# Patient Record
Sex: Male | Born: 1990 | Race: Black or African American | Hispanic: No | Marital: Single | Smoking: Former smoker
Health system: Southern US, Community
[De-identification: ages and names within clinical notes are randomized; demographics above are authoritative.]

## PROBLEM LIST (undated history)

## (undated) DIAGNOSIS — M23301 Other meniscus derangements, unspecified lateral meniscus, left knee: Secondary | ICD-10-CM

## (undated) DIAGNOSIS — S83519A Sprain of anterior cruciate ligament of unspecified knee, initial encounter: Secondary | ICD-10-CM

## (undated) HISTORY — PX: FINGER SURGERY: SHX640

---
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
## 2010-04-04 ENCOUNTER — Emergency Department (HOSPITAL_COMMUNITY): Admission: EM | Admit: 2010-04-04 | Discharge: 2010-04-04 | Payer: Self-pay | Admitting: Emergency Medicine

## 2010-05-02 ENCOUNTER — Emergency Department (HOSPITAL_COMMUNITY): Admission: EM | Admit: 2010-05-02 | Discharge: 2010-05-02 | Payer: Self-pay | Admitting: Emergency Medicine

## 2010-06-08 ENCOUNTER — Emergency Department (HOSPITAL_COMMUNITY): Admission: EM | Admit: 2010-06-08 | Discharge: 2010-06-08 | Payer: Self-pay | Admitting: Family Medicine

## 2010-06-08 ENCOUNTER — Emergency Department (HOSPITAL_COMMUNITY): Admission: EM | Admit: 2010-06-08 | Discharge: 2010-06-08 | Payer: Self-pay | Admitting: Emergency Medicine

## 2010-06-30 ENCOUNTER — Emergency Department (HOSPITAL_COMMUNITY): Admission: EM | Admit: 2010-06-30 | Discharge: 2010-06-30 | Payer: Self-pay | Admitting: Emergency Medicine

## 2010-10-29 LAB — POCT RAPID STREP A (OFFICE): Streptococcus, Group A Screen (Direct): NEGATIVE

## 2010-12-01 LAB — POCT URINALYSIS DIP (DEVICE)
Bilirubin Urine: NEGATIVE
Glucose, UA: NEGATIVE mg/dL
Hgb urine dipstick: NEGATIVE
Nitrite: NEGATIVE
Protein, ur: 30 mg/dL — AB
Specific Gravity, Urine: 1.02 (ref 1.005–1.030)
Urobilinogen, UA: 0.2 mg/dL (ref 0.0–1.0)
pH: 5 (ref 5.0–8.0)

## 2010-12-01 LAB — POCT I-STAT, CHEM 8
Calcium, Ion: 1.11 mmol/L — ABNORMAL LOW (ref 1.12–1.32)
Glucose, Bld: 98 mg/dL (ref 70–99)
HCT: 57 % — ABNORMAL HIGH (ref 36.0–49.0)
Hemoglobin: 19.4 g/dL — ABNORMAL HIGH (ref 12.0–16.0)
TCO2: 20 mmol/L (ref 0–100)
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
## 2012-08-22 ENCOUNTER — Encounter (HOSPITAL_COMMUNITY): Payer: Self-pay | Admitting: Emergency Medicine

## 2012-08-22 ENCOUNTER — Emergency Department (HOSPITAL_COMMUNITY)
Admission: EM | Admit: 2012-08-22 | Discharge: 2012-08-22 | Disposition: A | Discharge status: Home / Self Care | Payer: Self-pay | Attending: Emergency Medicine | Admitting: Emergency Medicine

## 2012-08-22 DIAGNOSIS — L03319 Cellulitis of trunk, unspecified: Secondary | ICD-10-CM | POA: Insufficient documentation

## 2012-08-22 DIAGNOSIS — L0291 Cutaneous abscess, unspecified: Secondary | ICD-10-CM

## 2012-08-22 DIAGNOSIS — R509 Fever, unspecified: Secondary | ICD-10-CM | POA: Insufficient documentation

## 2012-08-22 DIAGNOSIS — L02219 Cutaneous abscess of trunk, unspecified: Secondary | ICD-10-CM | POA: Insufficient documentation

## 2012-08-22 MED ORDER — ACETAMINOPHEN-CODEINE #3 300-30 MG PO TABS: 1.0000 | ORAL_TABLET | Freq: Four times a day (QID) | ORAL | Status: DC | PRN

## 2012-08-22 NOTE — ED Notes (Addendum)
Pt reports drainage coming from abscess; states his pain is getting better; pt given chux to place underneath drainage; odor noted;

## 2012-08-22 NOTE — ED Notes (Signed)
Discharge paperwork given to pt; prescriptions and follow-up care discussed with pt; pt had no additional questions regarding discharge or follow-up care; VSS; e-signature obtained;

## 2012-08-22 NOTE — ED Notes (Signed)
Pt reports wanting something to pain; MD notified; stated he wants to I & D abscess first; pt made aware;

## 2012-08-22 NOTE — ED Notes (Signed)
Pt c/o abscess to left thigh x 2 days with pain; pt denies drainage

## 2012-08-22 NOTE — ED Notes (Signed)
Pt given apple juice per request.

## 2012-08-22 NOTE — ED Provider Notes (Addendum)
History   This chart was scribed for Derwood Kaplan, MD by Gerlean Ren, ED Scribe. This patient was seen in room TR08C/TR08C and the patient's care was started at 4:45 PM    CSN: [id number]  Arrival date & time 08/22/12  1349   First MD Initiated Contact with Patient 08/22/12 1634      Chief Complaint  Patient presents with  . Abscess     The history is provided by the patient. No language interpreter was used.   Chase Greer is a 22 y.o. male who presents to the Emergency Department complaining of an abscess in left groin first noticed 2-3 days ago that has been gradually growing in size with associated gradually worsening pain currently rated as 10/10, fever, and chills.  Pt reports h/o one prior abscess on right leg.  Pt denies nausea and emesis.  Pt denies tobacco use but reports occasional alcohol use.   History reviewed. No pertinent past medical history.  History reviewed. No pertinent past surgical history.  History reviewed. No pertinent family history.  History  Substance Use Topics  . Smoking status: Never Smoker   . Smokeless tobacco: Not on file  . Alcohol Use: Yes     Comment: occ      Review of Systems  Constitutional: Positive for fever and chills.  Gastrointestinal: Negative for nausea and vomiting.  Musculoskeletal:       Abscess  All other systems reviewed and are negative.    Allergies  Review of patient's allergies indicates no known allergies.  Home Medications  No current outpatient prescriptions on file.  BP 149/83  Pulse 102  Temp 99.6 F (37.6 C) (Oral)  Resp 18  SpO2 99%  Physical Exam  Nursing note and vitals reviewed. Constitutional: He is oriented to person, place, and time. He appears well-developed and well-nourished. No distress.  HENT:  Head: Normocephalic and atraumatic.  Eyes: Conjunctivae normal are normal.  Neck: Neck supple. No tracheal deviation present.  Cardiovascular: Normal rate.   Pulmonary/Chest: Effort  normal. No respiratory distress.  Musculoskeletal: Normal range of motion.       Fluctuant tender left inguinal mass, mild erythema.  Neurological: He is alert and oriented to person, place, and time.  Skin: Skin is warm and dry.  Psychiatric: He has a normal mood and affect. His behavior is normal.    ED Course  Procedures (including critical care time) DIAGNOSTIC STUDIES: Oxygen Saturation is 99% on room air, normal by my interpretation.    COORDINATION OF CARE: 4:48 PM- Patient informed of clinical course, understands medical decision-making process, and agrees with plan.    Labs Reviewed - No data to display No results found.   No diagnosis found.    MDM  I personally performed the services described in this documentation, which was scribed in my presence. The recorded information has been reviewed and is accurate.  Pt comes in with cc of abscess. Has a fluctuant inguinal mass - tender - will drain. Exam otherwise benign.        Derwood Kaplan, MD 08/22/12 1727  Derwood Kaplan, MD 08/22/12 1728  INCISION AND DRAINAGE Performed by: Derwood Kaplan Consent: Verbal consent obtained. Risks and benefits: risks, benefits and alternatives were discussed Type: abscess  Body area:left groin  Anesthesia: local infiltration  Incision was made with a scalpel.  Local anesthetic: lidocaine 2% with epinephrine  Anesthetic total: 5 ml  Complexity: complex Blunt dissection to break up loculations  Drainage: purulent  Drainage amount: 5  ml  Packing material: not packed  Patient tolerance: Patient tolerated the procedure well with no immediate complications.    Derwood Kaplan, MD 08/22/12 1858

## 2012-08-22 NOTE — ED Notes (Signed)
MD at bedside. 

## 2012-11-25 ENCOUNTER — Emergency Department (HOSPITAL_COMMUNITY)
Admission: EM | Admit: 2012-11-25 | Discharge: 2012-11-25 | Disposition: A | Discharge status: Home / Self Care | Payer: Self-pay | Attending: Emergency Medicine | Admitting: Emergency Medicine

## 2012-11-25 ENCOUNTER — Encounter (HOSPITAL_COMMUNITY): Payer: Self-pay | Admitting: Cardiology

## 2012-11-25 DIAGNOSIS — S41009A Unspecified open wound of unspecified shoulder, initial encounter: Secondary | ICD-10-CM | POA: Insufficient documentation

## 2012-11-25 DIAGNOSIS — Z23 Encounter for immunization: Secondary | ICD-10-CM | POA: Insufficient documentation

## 2012-11-25 DIAGNOSIS — W503XXA Accidental bite by another person, initial encounter: Secondary | ICD-10-CM

## 2012-11-25 MED ORDER — TETANUS-DIPHTH-ACELL PERTUSSIS 5-2.5-18.5 LF-MCG/0.5 IM SUSP
0.5000 mL | Freq: Once | INTRAMUSCULAR | Status: AC
  Administered 2012-11-25: 0.5 mL via INTRAMUSCULAR
  Filled 2012-11-25: qty 0.5
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
## 2012-11-25 NOTE — ED Provider Notes (Signed)
Nursing Notes Reviewed/ Care Coordinated, and agree without changes. Applicable Imaging Reviewed.  Interpretation of Laboratory Data incorporated into ED treatment  Flint Melter, MD 11/25/12 1727

## 2013-01-04 ENCOUNTER — Emergency Department (HOSPITAL_COMMUNITY): Payer: Self-pay

## 2013-01-04 ENCOUNTER — Encounter (HOSPITAL_COMMUNITY): Payer: Self-pay | Admitting: Emergency Medicine

## 2013-01-04 ENCOUNTER — Emergency Department (HOSPITAL_COMMUNITY)
Admission: EM | Admit: 2013-01-04 | Discharge: 2013-01-04 | Disposition: A | Discharge status: Home / Self Care | Payer: Self-pay | Attending: Emergency Medicine | Admitting: Emergency Medicine

## 2013-01-04 DIAGNOSIS — W2209XA Striking against other stationary object, initial encounter: Secondary | ICD-10-CM | POA: Insufficient documentation

## 2013-01-04 DIAGNOSIS — M79645 Pain in left finger(s): Secondary | ICD-10-CM

## 2013-01-04 DIAGNOSIS — Y9289 Other specified places as the place of occurrence of the external cause: Secondary | ICD-10-CM | POA: Insufficient documentation

## 2013-01-04 DIAGNOSIS — Y9389 Activity, other specified: Secondary | ICD-10-CM | POA: Insufficient documentation

## 2013-01-04 DIAGNOSIS — S6990XA Unspecified injury of unspecified wrist, hand and finger(s), initial encounter: Secondary | ICD-10-CM | POA: Insufficient documentation
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
## 2013-01-04 NOTE — ED Notes (Signed)
Pt dc to home. Pt sts understanding to dc instructions. Pt ambulatory to exit without difficulty. 

## 2013-01-04 NOTE — ED Notes (Signed)
PA at bedside.
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
## 2013-01-05 NOTE — ED Provider Notes (Signed)
Medical screening examination/treatment/procedure(s) were performed by non-physician practitioner and as supervising physician I was immediately available for consultation/collaboration.    Mccade Sullenberger D Deeanne Deininger, MD 01/05/13 1359 

## 2013-03-02 ENCOUNTER — Encounter (HOSPITAL_COMMUNITY): Payer: Self-pay | Admitting: *Deleted

## 2013-03-02 ENCOUNTER — Emergency Department (INDEPENDENT_AMBULATORY_CARE_PROVIDER_SITE_OTHER)
Admission: EM | Admit: 2013-03-02 | Discharge: 2013-03-02 | Disposition: A | Discharge status: Home / Self Care | Payer: Self-pay | Source: Home / Self Care

## 2013-03-02 DIAGNOSIS — A088 Other specified intestinal infections: Secondary | ICD-10-CM

## 2013-03-02 DIAGNOSIS — A084 Viral intestinal infection, unspecified: Secondary | ICD-10-CM

## 2013-03-02 LAB — CBC WITH DIFFERENTIAL/PLATELET
Basophils Absolute: 0 10*3/uL (ref 0.0–0.1)
Basophils Relative: 0 % (ref 0–1)
Eosinophils Absolute: 0 10*3/uL (ref 0.0–0.7)
Eosinophils Relative: 1 % (ref 0–5)
Lymphs Abs: 1.7 10*3/uL (ref 0.7–4.0)
MCH: 30.3 pg (ref 26.0–34.0)
MCHC: 36.8 g/dL — ABNORMAL HIGH (ref 30.0–36.0)
MCV: 82.3 fL (ref 78.0–100.0)
Neutrophils Relative %: 69 % (ref 43–77)
Platelets: UNDETERMINED 10*3/uL (ref 150–400)
RDW: 12.7 % (ref 11.5–15.5)

## 2013-03-02 LAB — POCT URINALYSIS DIP (DEVICE)
Bilirubin Urine: NEGATIVE
Leukocytes, UA: NEGATIVE
Nitrite: NEGATIVE
Protein, ur: NEGATIVE mg/dL
pH: 8.5 — ABNORMAL HIGH (ref 5.0–8.0)

## 2013-03-02 LAB — POCT I-STAT, CHEM 8
Creatinine, Ser: 1.1 mg/dL (ref 0.50–1.35)
HCT: 49 % (ref 39.0–52.0)
Hemoglobin: 16.7 g/dL (ref 13.0–17.0)
Potassium: 3.8 mEq/L (ref 3.5–5.1)
Sodium: 142 mEq/L (ref 135–145)
TCO2: 24 mmol/L (ref 0–100)

## 2013-03-02 MED ORDER — ONDANSETRON 4 MG PO TBDP
4.0000 mg | ORAL_TABLET | Freq: Once | ORAL | Status: AC
  Administered 2013-03-02: 4 mg via ORAL

## 2013-03-02 MED ORDER — ONDANSETRON HCL 4 MG PO TABS: 4.0000 mg | ORAL_TABLET | Freq: Three times a day (TID) | ORAL | Status: DC | PRN

## 2013-03-02 MED ORDER — ONDANSETRON 4 MG PO TBDP
ORAL_TABLET | ORAL | Status: AC
  Filled 2013-03-02: qty 1

## 2013-03-02 NOTE — ED Provider Notes (Signed)
History    CSN: [id number] Arrival date & time 03/02/13  1129  First MD Initiated Contact with Patient 03/02/13 1250     Chief Complaint  Patient presents with  . Emesis   (Consider location/radiation/quality/duration/timing/severity/associated sxs/prior Treatment) HPI  22 yo bm presents today with a several hour history of nausea and vomiting.  Symptoms started about 8:00AM.  Felt fine yesterday.  Thinks that it might be from something he ate last night.  Some chills and abd tightness.  Denies fever, fatigue, constipation, diarrhea, bladder symptoms.   History reviewed. No pertinent past medical history. History reviewed. No pertinent past surgical history. No family history on file. History  Substance Use Topics  . Smoking status: Never Smoker   . Smokeless tobacco: Not on file  . Alcohol Use: Yes     Comment: occ    Review of Systems  Constitutional: Positive for chills, diaphoresis and appetite change. Negative for fever, activity change and fatigue.  HENT: Negative.   Eyes: Negative.   Respiratory: Negative.   Cardiovascular: Negative.   Gastrointestinal: Positive for nausea, vomiting and abdominal pain. Negative for diarrhea, constipation, blood in stool, abdominal distention, anal bleeding and rectal pain.  Endocrine: Negative.   Genitourinary: Negative.   Musculoskeletal: Negative.   Skin: Negative.   Neurological: Negative.   Hematological: Negative.   Psychiatric/Behavioral: Negative.     Allergies  Review of patient's allergies indicates no known allergies.  Home Medications   Current Outpatient Rx  Name  Route  Sig  Dispense  Refill  . ondansetron (ZOFRAN) 4 MG tablet   Oral   Take 1 tablet (4 mg total) by mouth every 8 (eight) hours as needed for nausea.   20 tablet   0    BP 124/74  Pulse 68  Temp(Src) 97.7 F (36.5 C) (Oral)  Resp 16  SpO2 100% Physical Exam  Constitutional: He is oriented to person, place, and time. He appears  well-developed and well-nourished.  HENT:  Head: Normocephalic and atraumatic.  Eyes: EOM are normal. Pupils are equal, round, and reactive to light.  Neck: Normal range of motion.  Cardiovascular: Regular rhythm.   Abdominal: Soft. Bowel sounds are normal. He exhibits no distension and no mass. There is tenderness (mild diffuse). There is no rebound and no guarding.  Musculoskeletal: Normal range of motion.  Neurological: He is alert and oriented to person, place, and time.  Skin: Skin is warm and dry.  Psychiatric: He has a normal mood and affect.    ED Course  Procedures (including critical care time) Labs Reviewed  CBC WITH DIFFERENTIAL - Abnormal; Notable for the following:    MCHC 36.8 (*)    All other components within normal limits  POCT URINALYSIS DIP (DEVICE) - Abnormal; Notable for the following:    pH 8.5 (*)    All other components within normal limits  POCT I-STAT, CHEM 8   No results found. 1. Viral gastroenteritis     MDM  Patient given zofran 4mg  SL and fluid challenge.  Tolerated well and felt better.  Script for zofran given.  Will return to urgent care 3-5 days if not better and sooner if needed or go to ED.  Voices understanding.    Meds ordered this encounter  Medications  . ondansetron (ZOFRAN-ODT) disintegrating tablet 4 mg    Sig:   . ondansetron (ZOFRAN) 4 MG tablet    Sig: Take 1 tablet (4 mg total) by mouth every 8 (eight) hours as needed  for nausea.    Dispense:  20 tablet    Refill:  0    Zonia Kief, PA-C 03/02/13 1423

## 2013-03-02 NOTE — ED Notes (Signed)
Pt  Reports  This  Am      He  Woke  Up  With  Nausea  And  Vomiting      Denys  Any pain    -  Reports   Last   Vomited  2.5  Hours  Ago     Denys  Any  Diarrhea         Sitting upright on  Exam table  Speaking in  Complete  sentances

## 2013-03-03 NOTE — ED Provider Notes (Signed)
Medical screening examination/treatment/procedure(s) were performed by a resident physician or non-physician practitioner and as the supervising physician I was immediately available for consultation/collaboration.  Clementeen Graham, MD   Rodolph Bong, MD 03/03/13 [phone]

## 2013-06-02 ENCOUNTER — Emergency Department (HOSPITAL_COMMUNITY)
Admission: EM | Admit: 2013-06-02 | Discharge: 2013-06-02 | Disposition: A | Discharge status: Home / Self Care | Payer: Self-pay | Attending: Emergency Medicine | Admitting: Emergency Medicine

## 2013-06-02 DIAGNOSIS — L720 Epidermal cyst: Secondary | ICD-10-CM

## 2013-06-02 DIAGNOSIS — Z792 Long term (current) use of antibiotics: Secondary | ICD-10-CM | POA: Insufficient documentation

## 2013-06-02 DIAGNOSIS — L723 Sebaceous cyst: Secondary | ICD-10-CM | POA: Insufficient documentation

## 2013-06-02 MED ORDER — CEPHALEXIN 500 MG PO CAPS: 500.0000 mg | ORAL_CAPSULE | Freq: Four times a day (QID) | ORAL | Status: DC

## 2013-06-02 NOTE — ED Notes (Signed)
Pt states he was bitten by an insect a couple of days ago in chest and under left eye. Cyst under left eye. Pt requesting abx for cyst. No swelling or bite noted on chest region. Pt denies pain.

## 2013-06-02 NOTE — ED Notes (Signed)
Bee sting to chest x 3 days ago;  Abscess under lt. Eye. No drainage.

## 2013-06-02 NOTE — ED Provider Notes (Signed)
CSN: [id number]     Arrival date & time 06/02/13  0030 History   First MD Initiated Contact with Patient 06/02/13 0054     Chief Complaint  Patient presents with  . Insect Bite   (Consider location/radiation/quality/duration/timing/severity/associated sxs/prior Treatment) The history is provided by the patient and medical records.   This is a 22 year old male presenting to the ED for what he thinks is an abscess under his left eye. Patient states he was stung by bee 3 days ago on his left chest, abscess appeared shortly after-- he is unsure if they are related. She denies any drainage or swelling of his left eye. No visual changes.  No fevers, sweats, or chills.  No hx of prior abscesses.  No hx of MRSA.  No treatments tried PTA.  No past medical history on file. No past surgical history on file. No family history on file. History  Substance Use Topics  . Smoking status: Never Smoker   . Smokeless tobacco: Not on file  . Alcohol Use: Yes     Comment: occ    Review of Systems  Skin:       Abscess?  All other systems reviewed and are negative.    Allergies  Review of patient's allergies indicates no known allergies.  Home Medications   Current Outpatient Rx  Name  Route  Sig  Dispense  Refill  . cephALEXin (KEFLEX) 500 MG capsule   Oral   Take 1 capsule (500 mg total) by mouth 4 (four) times daily.   40 capsule   0   . ondansetron (ZOFRAN) 4 MG tablet   Oral   Take 1 tablet (4 mg total) by mouth every 8 (eight) hours as needed for nausea.   20 tablet   0    BP 139/87  Pulse 81  Temp(Src) 98 F (36.7 C)  Resp 20  Ht 5\' 9"  (1.753 m)  Wt 192 lb (87.091 kg)  BMI 28.34 kg/m2  SpO2 96%  Physical Exam  Nursing note and vitals reviewed. Constitutional: He is oriented to person, place, and time. He appears well-developed and well-nourished. No distress.  HENT:  Head: Normocephalic and atraumatic.  Mouth/Throat: Oropharynx is clear and moist.  Eyes: Conjunctivae  and EOM are normal. Pupils are equal, round, and reactive to light.    Small, cystic appearing structure inferior left eye without lid involvement; no central fluctuance, drainage, surrounding swelling, erythema, induration, or signs of infection; no visual disturbance; no eye drainage  Neck: Normal range of motion. Neck supple.  Cardiovascular: Normal rate, regular rhythm and normal heart sounds.   Pulmonary/Chest: Effort normal and breath sounds normal. No respiratory distress. He has no wheezes.  No bite noted on left chest  Musculoskeletal: Normal range of motion.  Neurological: He is alert and oriented to person, place, and time.  Skin: Skin is warm and dry. He is not diaphoretic.  Psychiatric: He has a normal mood and affect.    ED Course  Procedures (including critical care time) Labs Review Labs Reviewed - No data to display Imaging Review No results found.  EKG Interpretation   None       MDM   1. Epidermal cyst of face    Appears to be epidermal cyst of left inferior eye without lid involvement.  Due to close proximity of left eye will cover with short course abx in case of underlying infection.  Rx keflex.  FU with opthalmologist, Dr. Vonna Kotyk, if cyst begins to extend into  left lower eyelid or begin experiencing visual changes.  Discussed plan with pt, he agreed.  Return precautions advised.  Garlon Hatchet, PA-C 06/02/13 [phone]

## 2013-06-02 NOTE — ED Provider Notes (Signed)
Medical screening examination/treatment/procedure(s) were performed by non-physician practitioner and as supervising physician I was immediately available for consultation/collaboration.  Jeromie Gainor T Antavia Tandy, MD 06/02/13 0715 

## 2013-11-29 ENCOUNTER — Encounter (HOSPITAL_COMMUNITY): Payer: Self-pay | Admitting: Emergency Medicine

## 2013-11-29 ENCOUNTER — Emergency Department (HOSPITAL_COMMUNITY)
Admission: EM | Admit: 2013-11-29 | Discharge: 2013-11-29 | Disposition: A | Discharge status: Home / Self Care | Payer: Self-pay | Attending: Emergency Medicine | Admitting: Emergency Medicine

## 2013-11-29 DIAGNOSIS — M549 Dorsalgia, unspecified: Secondary | ICD-10-CM | POA: Insufficient documentation

## 2013-11-29 DIAGNOSIS — Z87891 Personal history of nicotine dependence: Secondary | ICD-10-CM | POA: Insufficient documentation

## 2013-11-29 DIAGNOSIS — Z792 Long term (current) use of antibiotics: Secondary | ICD-10-CM | POA: Insufficient documentation

## 2013-11-29 DIAGNOSIS — Z202 Contact with and (suspected) exposure to infections with a predominantly sexual mode of transmission: Secondary | ICD-10-CM

## 2013-11-29 DIAGNOSIS — R300 Dysuria: Secondary | ICD-10-CM | POA: Insufficient documentation

## 2013-11-29 LAB — URINALYSIS, ROUTINE W REFLEX MICROSCOPIC
Bilirubin Urine: NEGATIVE
GLUCOSE, UA: NEGATIVE mg/dL
HGB URINE DIPSTICK: NEGATIVE
Ketones, ur: NEGATIVE mg/dL
Leukocytes, UA: NEGATIVE
Nitrite: NEGATIVE
PH: 8 (ref 5.0–8.0)
PROTEIN: NEGATIVE mg/dL
Specific Gravity, Urine: 1.011 (ref 1.005–1.030)
Urobilinogen, UA: 0.2 mg/dL (ref 0.0–1.0)

## 2013-11-29 MED ORDER — CEFTRIAXONE SODIUM 250 MG IJ SOLR
250.0000 mg | Freq: Once | INTRAMUSCULAR | Status: AC
  Administered 2013-11-29: 250 mg via INTRAMUSCULAR
  Filled 2013-11-29: qty 250

## 2013-11-29 MED ORDER — DOXYCYCLINE HYCLATE 100 MG PO CAPS: 100.0000 mg | ORAL_CAPSULE | Freq: Two times a day (BID) | ORAL | Status: DC

## 2013-11-29 MED ORDER — AZITHROMYCIN 250 MG PO TABS
1000.0000 mg | ORAL_TABLET | Freq: Once | ORAL | Status: AC
  Administered 2013-11-29: 1000 mg via ORAL
  Filled 2013-11-29: qty 4

## 2013-11-29 MED ORDER — DEXTROSE 5 % IV SOLN: 500.0000 mg | Freq: Once | INTRAVENOUS | Status: DC

## 2013-11-29 NOTE — ED Notes (Signed)
Patient states has had back pain, discolored urine (dark yellow), and painful urination.   Patient states that he has had symptoms x 3 days.   Denies any other symptoms.

## 2013-11-29 NOTE — Discharge Instructions (Signed)
Please call your doctor for a followup appointment within 24-48 hours. When you talk to your doctor please let them know that you were seen in the emergency department and have them acquire all of your records so that they can discuss the findings with you and formulate a treatment plan to fully care for your new and ongoing problems. Please call and set-up an appointment with Health and Wellness center Please call and set-up an appointment with Health Department to get further assessed and further STD work-up, recommend partner to be checked as well Please rest and stay hydrated Please take antibiotics as prescribed Please take Tylenol for back discomfort - no more than 4,000 gram/4 grams per day for this can lead to liver issues and tyelnol overdose Please continue to monitor symptoms closely and if symptoms are to worsen or change (fever greater than 101, chills, sweating, nausea, vomiting, diarrhea, penile drainage, penile discharge, chest pain, shortness of breathe, difficulty breathing, sores, lesions, rashes, abdominal pain) please report back to the ED immediately   Back Exercises Back exercises help treat and prevent back injuries. The goal of back exercises is to increase the strength of your abdominal and back muscles and the flexibility of your back. These exercises should be started when you no longer have back pain. Back exercises include:  Pelvic Tilt. Lie on your back with your knees bent. Tilt your pelvis until the lower part of your back is against the floor. Hold this position 5 to 10 sec and repeat 5 to 10 times.  Knee to Chest. Pull first 1 knee up against your chest and hold for 20 to 30 seconds, repeat this with the other knee, and then both knees. This may be done with the other leg straight or bent, whichever feels better.  Sit-Ups or Curl-Ups. Bend your knees 90 degrees. Start with tilting your pelvis, and do a partial, slow sit-up, lifting your trunk only 30 to 45 degrees  off the floor. Take at least 2 to 3 seconds for each sit-up. Do not do sit-ups with your knees out straight. If partial sit-ups are difficult, simply do the above but with only tightening your abdominal muscles and holding it as directed.  Hip-Lift. Lie on your back with your knees flexed 90 degrees. Push down with your feet and shoulders as you raise your hips a couple inches off the floor; hold for 10 seconds, repeat 5 to 10 times.  Back arches. Lie on your stomach, propping yourself up on bent elbows. Slowly press on your hands, causing an arch in your low back. Repeat 3 to 5 times. Any initial stiffness and discomfort should lessen with repetition over time.  Shoulder-Lifts. Lie face down with arms beside your body. Keep hips and torso pressed to floor as you slowly lift your head and shoulders off the floor. Do not overdo your exercises, especially in the beginning. Exercises may cause you some mild back discomfort which lasts for a few minutes; however, if the pain is more severe, or lasts for more than 15 minutes, do not continue exercises until you see your caregiver. Improvement with exercise therapy for back problems is slow.  See your caregivers for assistance with developing a proper back exercise program. Document Released: 09/10/2004 Document Revised: 10/26/2011 Document Reviewed: 06/04/2011 [facility] Patient Information 2014 [place], [place]. Sexually Transmitted Disease A sexually transmitted disease (STD) is a disease or infection that may be passed (transmitted) from person to person, usually during sexual activity. This may happen by way  of saliva, semen, blood, vaginal mucus, or urine. Common STDs include:   Gonorrhea.   Chlamydia.   Syphilis.   HIV and AIDS.   Genital herpes.   Hepatitis B and C.   Trichomonas.   Human papillomavirus (HPV).   Pubic lice.   Scabies.  Mites.  Bacterial vaginosis. WHAT ARE CAUSES OF STDs? An STD may be caused by  bacteria, a virus, or parasites. STDs are often transmitted during sexual activity if one person is infected. However, they may also be transmitted through nonsexual means. STDs may be transmitted after:   Sexual intercourse with an infected person.   Sharing sex toys with an infected person.   Sharing needles with an infected person or using unclean piercing or tattoo needles.  Having intimate contact with the genitals, mouth, or rectal areas of an infected person.   Exposure to infected fluids during birth. WHAT ARE THE SIGNS AND SYMPTOMS OF STDs? Different STDs have different symptoms. Some people may not have any symptoms. If symptoms are present, they may include:   Painful or bloody urination.   Pain in the pelvis, abdomen, vagina, anus, throat, or eyes.   Skin rash, itching, irritation, growths, sores (lesions), ulcerations, or warts in the genital or anal area.  Abnormal vaginal discharge with or without bad odor.   Penile discharge in men.   Fever.   Pain or bleeding during sexual intercourse.   Swollen glands in the groin area.   Yellow skin and eyes (jaundice). This is seen with hepatitis.   Swollen testicles.  Infertility.  Sores and blisters in the mouth. HOW ARE STDs DIAGNOSED? To make a diagnosis, your health care provider may:   Take a medical history.   Perform a physical exam.   Take a sample of any discharge for examination.  Swab the throat, cervix, opening to the penis, rectum, or vagina for testing.  Test a sample of your first morning urine.   Perform blood tests.   Perform a Pap smear, if this applies.   Perform a colposcopy.   Perform a laparoscopy.  HOW ARE STDs TREATED? Treatment depends on the STD. Some STDs may be treated but not cured.   Chlamydia, gonorrhea, trichomonas, and syphilis can be cured with antibiotics.   Genital herpes, hepatitis, and HIV can be treated, but not cured, with prescribed  medicines. The medicines lessen symptoms.   Genital warts from HPV can be treated with medicine or by freezing, burning (electrocautery), or surgery. Warts may come back.   HPV cannot be cured with medicine or surgery. However, abnormal areas may be removed from the cervix, vagina, or vulva.   If your diagnosis is confirmed, your recent sexual partners need treatment. This is true even if they are symptom-free or have a negative culture or evaluation. They should not have sex until their health care providers say it is OK. HOW CAN I REDUCE MY RISK OF GETTING AN STD?  Use latex condoms, dental dams, and water-soluble lubricants during sexual activity. Do not use petroleum jelly or oils.  Get vaccinated for HPV and hepatitis. If you have not received these vaccines in the past, talk to your health care provider about whether one or both might be right for you.   Avoid risky sex practices that can break the skin.  WHAT SHOULD I DO IF I THINK I HAVE AN STD?  See your health care provider.   Inform all sexual partners. They should be tested and treated for any STDs.  Do not have sex until your health care provider says it is OK. WHEN SHOULD I GET HELP? Seek immediate medical care if:  You develop severe abdominal pain.  You are a man and notice swelling or pain in the testicles.  You are a woman and notice swelling or pain in your vagina. Document Released: 10/24/2002 Document Revised: 05/24/2013 Document Reviewed: 02/21/2013 [facility] Patient Information 2014 [place], [place].   Emergency Department Resource Guide 1) Find a Doctor and Pay Out of Pocket Although you won't have to find out who is covered by your insurance plan, it is a good idea to ask around and get recommendations. You will then need to call the office and see if the doctor you have chosen will accept you as a new patient and what types of options they offer for patients who are self-pay. Some doctors offer  discounts or will set up payment plans for their patients who do not have insurance, but you will need to ask so you aren't surprised when you get to your appointment.  2) Contact Your Local Health Department Not all health departments have doctors that can see patients for sick visits, but many do, so it is worth a call to see if yours does. If you don't know where your local health department is, you can check in your phone book. The CDC also has a tool to help you locate your state's health department, and many state websites also have listings of all of their local health departments.  3) Find a Walk-in Clinic If your illness is not likely to be very severe or complicated, you may want to try a walk in clinic. These are popping up all over the country in pharmacies, drugstores, and shopping centers. They're usually staffed by nurse practitioners or physician assistants that have been trained to treat common illnesses and complaints. They're usually fairly quick and inexpensive. However, if you have serious medical issues or chronic medical problems, these are probably not your best option.  No Primary Care Doctor: - Call Health Connect at  [phone] - they can help you locate a primary care doctor that  accepts your insurance, provides certain services, etc. - Physician Referral Service- [phone]  Chronic Pain Problems: Organization         Address  Phone   Notes  [facility]  [phone] Patients need to be referred by their primary care doctor.   Medication Assistance: Organization         Address  Phone   Notes  [facility] [address] [place], [place] [address] [phone] --Must be a resident of [facility] [place] -- Must have NO insurance coverage whatsoever (no Medicaid/ Medicare, etc.) -- The pt. MUST have a primary care doctor that directs their care regularly and follows them in the community   [facility]ist   [phone]   [employer]  [phone]    Agencies that provide inexpensive medical care: Organization         Address  Phone   Notes  [facility]  [phone]   [facility]    [phone]   [facility] [address] [place], [place] [address] [phone]   [facility] [address], [place] [phone]   [facility]    [phone]   [facility]    [phone](336) [phone]   [facility]  [address], [place] Phone:  [phone], Fax:  [phone] Hours of Operation:  9 am - 6 pm, M-F.  Also accepts Medicaid/Medicare and self-pay.  [facility]  [address], [place] Phone: [phone], Fax: [phone]. Hours of Operation:  8:30 am - 5:30 pm, M-F.  Also accepts Medicaid and self-pay.  [facility] [place] [address] [place] Phone: [phone]   [facility] [address] Natasha Bence [place], [place] [phone], Ext. [phone] Mondays & Thursdays: 7-9 AM.  First 15 patients are seen on a first come, first serve basis.    Medicaid-accepting [facility] Providers:  Organization         Address  Phone   Notes  [facility] [address],  [phone] Also accepts self-pay patients.  [facility] [address] Laurell Josephs [place], [place]  [phone]   [facility] [address], [place] [phone]   [facility] [address] [place] [phone]   Renaye Rakers [address], [place]   [phone] Only accepts [place] Access [place] patients after they have their name applied to their card.   Self-Pay (no insurance) in [facility]:  Organization         Address  Phone   Notes  Sickle Cell Patients, [facility] [address] [place], [place] [phone]   [facility] [address] [place], [place] [phone]   [facility]  [address], [place] [phone]   [facility]/Dr. Osei-Bonsu  [address], [place] or [address], [place] [phone] Phone number for both [place] and [place] locations is the same.  [facility] [address], [place] [phone]   [facility] [address], [place] or [address] [phone]   [facility] [address] [place] [phone], phone; [phone], fax Sees patients 1st and 3rd Saturday of every month.  Must not qualify for public or private insurance (i.e. Medicaid, Medicare, [place] Health Choice, Veterans' Benefits)  Household income should be no more than 200% of the poverty level The clinic cannot treat you if you are pregnant or think you are pregnant  Sexually transmitted diseases are not treated at the clinic.    Dental Care: Organization         Address  Phone  Notes  [facility] [address] [employer], [place] [phone] Accepts children up to age 47 who are enrolled in [place] or [place] Health Choice; pregnant women with a Medicaid card; and children who have applied for Medicaid or [place] Health Choice, but were declined, whose parents can pay a reduced fee at time of service.  [facility] of [facility]  [address], [place] [phone] Accepts children up to age 53 who are enrolled in [place] or [place] Health Choice; pregnant women with a Medicaid card; and children who have applied for Medicaid or [place] Health Choice, but were declined, whose parents can pay a reduced fee at time of service.  [facility]  [address] [place], [place]  [phone] Patients are seen by appointment only. Walk-ins are not accepted. [facility] will see patients 76 years of age and older. Monday - Tuesday (8am-5pm) Most Wednesdays (8:30-5pm) $30 per visit, cash only  [facility]  [address], [facility] [phone] Patients are seen by appointment only. Walk-ins are not accepted. [facility] will see patients 99 years of age and older. One Wednesday Evening (Monthly: Volunteer Based).  $30 per visit, cash only  [employer]  [phone] for adults; Children under age 21, call Graduate Pediatric Dentistry at [phone]. Children aged 53-14, please call [phone] to request a pediatric application.  Dental services are provided in all areas of dental care including fillings, crowns and bridges, complete and partial dentures, implants, gum treatment, root canals, and extractions. Preventive care is also provided. Treatment is provided to both adults and children. Patients are selected via a lottery and there is often a waiting list.   [facility] [address] [place]  [phone] [email or web address]   [facility] [address] [place], [place] [phone], Ext. [phone] Second and Fourth Thursday of each month, opens at 6:30 AM; Clinic ends at 9 AM.  Patients are seen on a first-come first-served basis, and a limited number are seen during each clinic.   [facility]  [address] Ether Griffins [place], [place] [phone]   Eligibility Requirements You must have lived in [place], [place], or [place] counties for at least the last three months.   You cannot be eligible for state or federal sponsored [place], including [employer], [place], or [employer].   You generally cannot be eligible for healthcare insurance through your employer.    How to apply: Eligibility screenings are held every Tuesday and Wednesday  afternoon from 1:00 pm until 4:00 pm. You do not need an appointment for the interview!  [facility] [address] [place], [place] [phone]   [facility] Department  [phone]   [facility] Health Department  [phone]   [facility] Health Department  [phone]    Behavioral Health Resources in the Community: Intensive Outpatient Programs Organization         Address  Phone  Notes  [facility] [address] [place], [place] [phone]   [facility] Outpatient [address] [place], [place] [phone]   [facility] [address], [place], [place]  [phone]   [facility] [address],  [place], [place] [phone] or [phone]   Substance Abuse Resources Organization         Address  Phone  Notes  [facility] Drug Services  [phone]   [facility]  [phone]   [facility] [place]  [phone]   Floydene Flock  [phone]   Residential & Outpatient Substance Abuse Program  [phone]   Psychological Services Organization         Address  Phone  Notes  [facility]  [phone]   [facility] Services  [phone]   [facility] [address], [place] [phone] or [phone]    Mobile Crisis Teams Organization         Address  Phone  Notes  [facility], Mobile Crisis Care Unit  [phone]   [facility]  [address] [place], [place] [phone]   Doristine Locks [address] [place] [phone]    Self-Help/Support Groups Organization         Address  Phone             Notes  [facility] [place] - variety of support groups  [phone]- I7437963 Call for more information  Narcotics Anonymous (NA), [facility] [address], [employer] [place]  2 meetings at this location   Nutritional therapist         Address  Phone  Notes  [facility] [address],    [place]  [phone]   [facility]  [address] [place] [address], [place], [place] [phone]   [facility] [address] [place], [place] [phone] Admissions: 8am-3pm M-F  [facility] [address],    [place], [place] [phone]   [facility] [address] [place], [place], [place] [phone]   [facility] [address],  [place], [place] [phone]   [facility] - Intensive Outpatient [address] Laurell Josephs [address], [place], [place] [phone]   [facility] ([facility]iction [facility] [address] [place].,  [place], [place] [phone] or [phone]   [facility] ([facility]) [address], [place], [place] [phone] Accepts Medicaid  [facility] [address]  [place] [phone] Substance Abuse/Addiction Treatment   [facility] Organization         Address  Phone  Notes  [facility]  [phone]   Angie Fava, PhD [address] Ervin Knack [place], [place]   [phone] or [phone]   [facility]   [address] [place], [place] [phone]   [facility] [address], [place], [place] [phone] Insurance/Medicaid/sponsorship through [facility] Families [address]                                    [place], [place] [phone] Therapy/tele-psych/case  [facility] [address], [place] [phone]    Dr. Lolly Mustache  [phone]   [facility] [place]  [employer] [facility] Dept. 1) [address] [place] 2) [address] [place] 3)  [address], [place] [phone]  [phone]   [facility] Child Abuse Hotline [phone] or [phone] (After Hours)

## 2013-11-29 NOTE — ED Provider Notes (Signed)
CSN: [id number]     Arrival date & time 11/29/13  0919 History  This chart was scribed for Raymon Mutton, PA, working with Benny Lennert, MD, by Ardelia Mems ED Scribe. This patient was seen in room TR07C/TR07C and the patient's care was started at 9:32 AM.   Chief Complaint  Patient presents with  . Dysuria  . Back Pain    The history is provided by the patient. No language interpreter was used.    HPI Comments: Chase Greer is a 23 y.o. male who presents to the Emergency Department complaining of "burning, stinging" dysuria over the past week. He states that his symptoms seem to be improving. He reports having associated intermittent, mild upper back pain only when he turns his back. He denies any known injury or exertion to have onset his back pain. He states that he has tried no medications for any of his symptoms. He states that he is sexually active with 1 male partner and he does not use protection. He denies noticing any genital sores, penile discharge or swelling or any other symptoms. He denies hematuria, fever, chills, abdominal pain, nausea, emesis, diarrhea, blood in stools, headache, cough, congestion, rash or any other symptoms. He denies smoking.   History reviewed. No pertinent past medical history. History reviewed. No pertinent past surgical history. No family history on file. History  Substance Use Topics  . Smoking status: Former Games developer  . Smokeless tobacco: Not on file  . Alcohol Use: Yes     Comment: occ    Review of Systems  Constitutional: Negative for fever and chills.  HENT: Negative for congestion.   Respiratory: Negative for cough.   Gastrointestinal: Negative for nausea, vomiting, abdominal pain, diarrhea and blood in stool.  Genitourinary: Positive for dysuria. Negative for hematuria, discharge, penile swelling and genital sores.  Musculoskeletal: Positive for back pain.  Skin: Negative for rash.  Neurological: Negative for headaches.   All other systems reviewed and are negative.   Allergies  Review of patient's allergies indicates no known allergies.  Home Medications   Prior to Admission medications   Medication Sig Start Date End Date Taking? Authorizing Provider  cephALEXin (KEFLEX) 500 MG capsule Take 1 capsule (500 mg total) by mouth 4 (four) times daily. 06/02/13   Garlon Hatchet, PA-C  ondansetron (ZOFRAN) 4 MG tablet Take 1 tablet (4 mg total) by mouth every 8 (eight) hours as needed for nausea. 03/02/13   Zonia Kief, PA-C   Triage Vitals: BP 124/75  Pulse 64  Temp(Src) 97.6 F (36.4 C) (Oral)  Resp 18  Ht 5\' 9"  (1.753 m)  Wt 190 lb (86.183 kg)  BMI 28.05 kg/m2  SpO2 99%  Physical Exam  Nursing note and vitals reviewed. Constitutional: He is oriented to person, place, and time. He appears well-developed and well-nourished. No distress.  HENT:  Head: Normocephalic and atraumatic.  Mouth/Throat: Oropharynx is clear and moist. No oropharyngeal exudate.  Negative swelling, erythema, inflammation, lesions, sores, petechiae noted to the posterior oropharynx. Negative bilateral tonsillar adenopathy noted. Uvula midline with symmetrical elevation.  Eyes: Conjunctivae and EOM are normal. Pupils are equal, round, and reactive to light. Right eye exhibits no discharge. Left eye exhibits no discharge.  Neck: Normal range of motion. Neck supple. No tracheal deviation present.  Negative neck stiffness Negative nuchal rigidity Negative cervical lymphadenopathy Negative meningeal signs  Cardiovascular: Normal rate, regular rhythm and normal heart sounds.  Exam reveals no friction rub.   No murmur heard. Pulses:  Radial pulses are 2+ on the right side, and 2+ on the left side.       Dorsalis pedis pulses are 2+ on the right side, and 2+ on the left side.  Pulmonary/Chest: Effort normal and breath sounds normal. No respiratory distress. He has no wheezes. He has no rales.  Patient stable to speak in full  sentences without difficulty Negative use of accessory muscles Negative stridor  Abdominal: Soft. Bowel sounds are normal. There is no tenderness. There is no guarding.  Genitourinary:  Penile Exam: Circumcised. Negative swelling, erythema, inflammation, lesions, sores noted to the penis. Negative chancres. Negative swelling, hydrocele, varicocele noted to the testicles. Negative high riding testicle. Negative pain upon palpation to the testicles bilaterally or scrotum. Negative swelling, erythema, formation noted to the testicles or scrotum. Negative inguinal lymphadenopathy. Negative penile pain. Negative active drainage or bleeding noted to the penis. Negative fluid filled pustules. Exam chaperoned with nurse.  Musculoskeletal: Normal range of motion. He exhibits no edema and no tenderness.  Negative swelling, erythema, inflammation, lesions, sores, bulging, deformities noted to the cervical/thoracic/lumbosacral regions of the spine. Full rotation of torso without difficulty or pain. Negative pain upon palpation to the cervical/thoracic/lumbosacral spine. Negative crepitus noted. Full ROM to upper and lower extremities without difficulty noted, negative ataxia noted.  Lymphadenopathy:    He has no cervical adenopathy.  Neurological: He is alert and oriented to person, place, and time. No cranial nerve deficit. He exhibits normal muscle tone. Coordination normal.  Cranial nerves III-XII grossly intact Strength 5+/5+ to upper and lower extremities bilaterally with resistance applied, equal distribution noted Sensation intact with differentiation to sharp and dull touch Gait proper, proper balance - negative sway, negative drift, negative step-offs  Skin: Skin is warm and dry. No rash noted. He is not diaphoretic. No erythema.  Psychiatric: He has a normal mood and affect. His behavior is normal. Thought content normal.    ED Course  Procedures (including critical care time)  DIAGNOSTIC  STUDIES: Oxygen Saturation is 99% on RA, normal by my interpretation.    COORDINATION OF CARE: 9:38 AM- Discussed plan to obtain UA along with plan to screen for STDs. Pt advised of plan for treatment and pt agrees.  Results for orders placed during the hospital encounter of 11/29/13  URINALYSIS, ROUTINE W REFLEX MICROSCOPIC      Result Value Ref Range   Color, Urine YELLOW  YELLOW   APPearance CLEAR  CLEAR   Specific Gravity, Urine 1.011  1.005 - 1.030   pH 8.0  5.0 - 8.0   Glucose, UA NEGATIVE  NEGATIVE mg/dL   Hgb urine dipstick NEGATIVE  NEGATIVE   Bilirubin Urine NEGATIVE  NEGATIVE   Ketones, ur NEGATIVE  NEGATIVE mg/dL   Protein, ur NEGATIVE  NEGATIVE mg/dL   Urobilinogen, UA 0.2  0.0 - 1.0 mg/dL   Nitrite NEGATIVE  NEGATIVE   Leukocytes, UA NEGATIVE  NEGATIVE    Labs Review Labs Reviewed  GC/CHLAMYDIA PROBE AMP  URINALYSIS, ROUTINE W REFLEX MICROSCOPIC    Imaging Review No results found.   EKG Interpretation None      MDM   Final diagnoses:  Dysuria  Possible exposure to STD  Back pain   Medications  cefTRIAXone (ROCEPHIN) injection 250 mg (250 mg Intramuscular Given 11/29/13 1050)  azithromycin (ZITHROMAX) tablet 1,000 mg (1,000 mg Oral Given 11/29/13 1054)   Filed Vitals:   11/29/13 0930 11/29/13 1109  BP: 124/75 123/82  Pulse: 64 61  Temp: 97.6 F (36.4  C)   TempSrc: Oral   Resp: 18 16  Height: 5\' 9"  (1.753 m)   Weight: 190 lb (86.183 kg)   SpO2: 99% 100%    I personally performed the services described in this documentation, which was scribed in my presence. The recorded information has been reviewed and is accurate.  Patient presenting to the ED with thoracic back pain described as a poking sensation that only occurs with motion for the past week. Patient is concerned regarding dysuria does been ongoing for one week. Stated that this occurs with each urination. Denied pyuria or hematuria. Stated that he is sexually active and does not use  protection-reported that he only has one partner. Denied rashes, swelling to the penis, penile discharge, penile bleeding, sores, fall, injury. Alert and oriented. GCS 15. Heart rate and rhythm normal. Lungs clear to auscultation to upper and lower lobes bilaterally-negative signs of respiratory distress. Patient is able to speak in full sentences that difficulty. Negative use of accessory muscles. Radial and DP pulses 2+ bilaterally. Negative deformities identified to the cervical/thoracic/MO sacral respond with negative pain upon palpation. Full range of motion to the torso without difficulty or pain noted. Bowel sounds normoactive in all 4 quadrants, abdomen soft with negative pain upon palpation-nonsurgical abdomen noted. Full range of motion to upper and lower extremity bilaterally without difficulty or ataxia. Strength intact with equal tissue effusion. Sensation intact. Penile exam unremarkable. Urinalysis negative for nitrites-negative leukocytes. Negative signs of infection. Doubt UTI. Doubt pyelonephritis. Doubt herpes. Doubt syphilis. Cannot rule out possible GC/Chlamydia. Suspicion of back discomfort to be muscular in nature secondary to pain with motion. Negative signs of crepitus-negative respiratory distress noted. Patient is sexually active without protection-presenting with dysuria, will cover patient for STD prophylaxis. Patient stable, afebrile. Patient not septic appearing. Discharged patient. Discussed with patient to rest and stay hydrated. Discussed with patient to avoid any sexual activities until rechecked and ruled out for further infection. Discussed the patient for partner to get assessed as well. Referred patient to health department for further workup to be performed regarding STDs. Discharged patient with doxycycline. Discussed with patient to stay hydrated. Recommended Tylenol for back discomfort. [facility] Discussed with patient to closely  monitor symptoms and if symptoms are to worsen or change to report back to the ED - strict return instructions given.  Patient agreed to plan of care, understood, all questions answered.   Raymon MuttonMarissa Japji Kok, PA-C 11/29/13 1921

## 2013-11-30 LAB — GC/CHLAMYDIA PROBE AMP
CT PROBE, AMP APTIMA: NEGATIVE
GC PROBE AMP APTIMA: NEGATIVE

## 2013-11-30 NOTE — ED Provider Notes (Signed)
Medical screening examination/treatment/procedure(s) were performed by non-physician practitioner and as supervising physician I was immediately available for consultation/collaboration.   EKG Interpretation None        Chelbi Herber L Vickye Astorino, MD 11/30/13 1426 

## 2013-12-24 ENCOUNTER — Encounter (HOSPITAL_COMMUNITY): Payer: Self-pay | Admitting: Emergency Medicine

## 2013-12-24 ENCOUNTER — Emergency Department (HOSPITAL_COMMUNITY)
Admission: EM | Admit: 2013-12-24 | Discharge: 2013-12-24 | Disposition: A | Discharge status: Home / Self Care | Payer: Self-pay | Attending: Emergency Medicine | Admitting: Emergency Medicine

## 2013-12-24 DIAGNOSIS — Z87891 Personal history of nicotine dependence: Secondary | ICD-10-CM | POA: Insufficient documentation

## 2013-12-24 DIAGNOSIS — S51809A Unspecified open wound of unspecified forearm, initial encounter: Secondary | ICD-10-CM | POA: Insufficient documentation

## 2013-12-24 DIAGNOSIS — Y929 Unspecified place or not applicable: Secondary | ICD-10-CM | POA: Insufficient documentation

## 2013-12-24 DIAGNOSIS — W503XXA Accidental bite by another person, initial encounter: Secondary | ICD-10-CM | POA: Insufficient documentation

## 2013-12-24 DIAGNOSIS — S41109A Unspecified open wound of unspecified upper arm, initial encounter: Secondary | ICD-10-CM | POA: Insufficient documentation

## 2013-12-24 DIAGNOSIS — Z23 Encounter for immunization: Secondary | ICD-10-CM | POA: Insufficient documentation

## 2013-12-24 DIAGNOSIS — Y9389 Activity, other specified: Secondary | ICD-10-CM | POA: Insufficient documentation

## 2013-12-24 MED ORDER — AMOXICILLIN-POT CLAVULANATE 875-125 MG PO TABS: 1.0000 | ORAL_TABLET | Freq: Two times a day (BID) | ORAL | Status: DC

## 2013-12-24 MED ORDER — TETANUS-DIPHTH-ACELL PERTUSSIS 5-2.5-18.5 LF-MCG/0.5 IM SUSP
0.5000 mL | Freq: Once | INTRAMUSCULAR | Status: AC
  Administered 2013-12-24: 0.5 mL via INTRAMUSCULAR
  Filled 2013-12-24: qty 0.5
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
## 2013-12-24 NOTE — ED Provider Notes (Signed)
Medical screening examination/treatment/procedure(s) were performed by non-physician practitioner and as supervising physician I was immediately available for consultation/collaboration.   Suleika Donavan, MD 12/24/13 0649 
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
## 2013-12-27 ENCOUNTER — Emergency Department (HOSPITAL_COMMUNITY): Payer: Self-pay

## 2013-12-27 ENCOUNTER — Encounter (HOSPITAL_COMMUNITY): Payer: Self-pay | Admitting: Emergency Medicine

## 2013-12-27 ENCOUNTER — Emergency Department (HOSPITAL_COMMUNITY)
Admission: EM | Admit: 2013-12-27 | Discharge: 2013-12-27 | Disposition: A | Discharge status: Home / Self Care | Payer: Self-pay | Attending: Emergency Medicine | Admitting: Emergency Medicine

## 2013-12-27 DIAGNOSIS — Y9389 Activity, other specified: Secondary | ICD-10-CM | POA: Insufficient documentation

## 2013-12-27 DIAGNOSIS — Z87891 Personal history of nicotine dependence: Secondary | ICD-10-CM | POA: Insufficient documentation

## 2013-12-27 DIAGNOSIS — S82142A Displaced bicondylar fracture of left tibia, initial encounter for closed fracture: Secondary | ICD-10-CM

## 2013-12-27 DIAGNOSIS — IMO0002 Reserved for concepts with insufficient information to code with codable children: Secondary | ICD-10-CM | POA: Insufficient documentation

## 2013-12-27 DIAGNOSIS — Y92410 Unspecified street and highway as the place of occurrence of the external cause: Secondary | ICD-10-CM | POA: Insufficient documentation

## 2013-12-27 DIAGNOSIS — Z792 Long term (current) use of antibiotics: Secondary | ICD-10-CM | POA: Insufficient documentation

## 2013-12-27 DIAGNOSIS — S82109A Unspecified fracture of upper end of unspecified tibia, initial encounter for closed fracture: Secondary | ICD-10-CM | POA: Insufficient documentation

## 2013-12-27 MED ORDER — HYDROCODONE-ACETAMINOPHEN 5-325 MG PO TABS: 1.0000 | ORAL_TABLET | Freq: Four times a day (QID) | ORAL | Status: DC | PRN

## 2013-12-27 MED ORDER — ACETAMINOPHEN 325 MG PO TABS
650.0000 mg | ORAL_TABLET | Freq: Once | ORAL | Status: AC
  Administered 2013-12-27: 650 mg via ORAL
  Filled 2013-12-27: qty 2
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
## 2013-12-27 NOTE — Discharge Instructions (Signed)
Please call your doctor for a followup appointment within 24-48 hours. When you talk to your doctor please let them know that you were seen in the emergency department and have them acquire all of your records so that they can discuss the findings with you and formulate a treatment plan to fully care for your new and ongoing problems. Please call and set-up an appointment with Dr. Magnus Ivan, he is expecting to see you this week Please keep leg in knee immobilizer at all times - can remove to keep abrasions clean Please rest and stay hydrated - please drink plenty of fluids Please do not apply any pressure to the left leg  Please use crutches when active at all times Please take pain medications as prescribed - while on pain medications there is to be no drinking alcohol, driving, operating heavy machinery. Please do not take any extra tylenol with this medication for this can lead to Tylenol overdose and liver issues.  Please continue to monitor symptoms and if symptoms are to worsen or change (fever greater than 101, chills, sweating, nausea, vomiting, chest pain, shortness of breath, difficulty breathing, numbness, tingling, loss of sensation, headaches, blurred vision, sudden loss of vision, fall, injury) please report back to the ED immediately  Tibial Plateau Fracture with Rehab The tibial plateau is the top surface of the shinbone (tibia) and is part of the knee joint. A tibial plateau fracture is a break that occurs in this portion of the shinbone. Tibial plateau fractures are fairly common. This injury is also known as a "bumper injury," because it often occurs when a person is hit by the bumper of a car.  SYMPTOMS   Severe pain at the fracture site, at the time of injury, which may continue over a period of time.  Tenderness, inflammation, and/or bruising (contusion) over the fracture site.  Decreased knee function.  Inability to stand or walk on the injured leg.  Visible deformity, if  the bone fragments are not properly aligned (displaced fracture).  Signs of vascular damage: numbness and coldness below the injury site. CAUSES  Tibial plateau fractures occur when a force is placed on the bone that is greater than it can withstand. Common causes of injury include:  Direct hit (trauma) to the tibial plateau.  Indirect trauma, such as a twisting or bending injury. RISK INCREASES WITH:  Contact sports (football, rugby, lacrosse).  Motor sports.  Bone disease (osteoporosis, bone tumor).  Metabolism disorders, hormone problems, nutritional deficiency, or eating disorders (anorexia, bulimia).  Poor strength and flexibility. PREVENTION  Warm up and stretch properly before activity.  Maintain physical fitness:  Strength, flexibility, and endurance.  Cardiovascular fitness.  Wear properly fitted and padded protective equipment (i.e. shin guards for soccer). PROGNOSIS  If treated properly, tibial plateau fractures usually heal.  RELATED COMPLICATIONS   Fracture fails to heal (nonunion).  Fracture heals in a poor position (malunion).  Bleeding within the leg that leads to the squeezing of nerves inside a closed space (compartment syndrome), causing injury to the nerves or vessels in the leg.  Shortening of the injured bones.  Shinbone growth stopping in children.  Infection, if the skin is broken over the fracture site (open fracture).  Arthritis of the knee.  Longer healing time, if not properly treated or re-injured.  Stiff knee.  Risks of surgery: infection, bleeding, nerve damage, or damage to surrounding tissues. TREATMENT Treatment first involves the use of ice and medicine to reduce pain and inflammation. If the bone  fragments are out of alignment (displaced fracture), immediate realignment of the bone (reduction) by a trained professional is required. Fractures that cannot be realigned by hand, or are open (bones poking through the skin), may  require surgery to hold the fracture in place with screws, pins, and plates. Once the bone is aligned, the knee should be restrained to allow for healing. After restraint, it is important to perform strengthening and stretching exercises to help regain strength and a full range of motion. These exercises may be completed at home or with a therapist.  MEDICATION   If pain medicine is needed, nonsteroidal anti-inflammatory medicines (aspirin and ibuprofen), or other minor pain relievers (acetaminophen), are often recommended.  Do not take pain medicine for 7 days before surgery.  Prescription pain relievers may be given if your caregiver thinks they are needed. Use only as directed and only as much as you need. COLD THERAPY  Cold treatment (icing) relieves pain and reduces inflammation. Cold treatment should be applied for 10 to 15 minutes every 2 to 3 hours, and immediately after activity that aggravates your symptoms. Use ice packs or an ice massage. SEEK MEDICAL CARE IF:  Treatment does not seem to help, or the condition gets worse.  Any medicines produce negative side effects.  Any complications from surgery occur:  Pain, numbness, or coldness in the affected leg.  Discoloration under the toenails (blue or gray) of the affected leg.  Signs of infection (fever, pain, inflammation, redness, or persistent bleeding). EXERCISES RANGE OF MOTION (ROM) AND STRETCHING EXERCISES - Tibial Plateau Fracture These exercises may help you when beginning to rehabilitate your injury. Your symptoms may go away with or without further involvement from your physician, physical therapist or athletic trainer. While completing these exercises, remember:   Restoring tissue flexibility helps normal motion to return to the joints. This allows healthier, less painful movement and activity.  An effective stretch should be held for at least 30 seconds.  A stretch should never be painful. You should only feel a  gentle lengthening or release in the stretched tissue. RANGE OF MOTION - Knee Flexion and Extension, Active-Assisted  Sit on the edge of a table or chair with your thighs firmly supported. It may help to place a folded towel under the end of your right / left thigh.  Flexion (bending): Place the ankle of your healthy leg on top of the other ankle. Use your healthy leg to gently bend your right / left knee until you feel a mild tension across the top of your knee.  Hold for __________ seconds.  Extension (straightening): Switch your ankles so your right / left leg is on top. Use your healthy leg to straighten your right / left knee until you feel a mild tension on the backside of your knee.  Hold for __________ seconds. Repeat __________ times. Complete __________ times per day. RANGE OF MOTION - Knee Flexion, Active  Lie on your back with both knees straight. (If this causes back discomfort, bend the knee of your healthy leg, placing your foot flat on the floor.)  Slowly slide your right / left heel back toward your buttocks until you feel a gentle stretch in the front of your knee or thigh.  Hold for __________ seconds. Slowly slide your heel back to the starting position. Repeat __________ times. Complete this exercise __________ times per day.  STRETCH - Knee Flexion, Supine  Lie on the floor with your right / left heel and foot lightly touching  the wall (place both feet on the wall if you do not use a door frame).  Without using any effort, allow gravity to slide your foot down the wall slowly, until you feel a gentle stretch in the front of your right / left knee.  Hold this stretch for __________ seconds. Then, return the leg to the starting position, using your healthy leg for help, if needed. Repeat __________ times. Complete this stretch __________ times per day.  STRETCH  Hamstrings, Supine   Lie on your back. Loop a belt or towel over the ball of your right / left  foot.  Straighten your right / left knee and slowly pull on the belt to raise your leg. Do not allow the right / left knee to bend. Keep your opposite leg flat on the floor.  Raise the leg until you feel a gentle stretch behind your right / left knee or thigh. Hold this position for __________ seconds. Repeat __________ times. Complete this stretch __________ times per day.  STRETCH - Hamstrings, Doorway  Lie on your back with your right / left leg extended and resting on the wall, and your opposite leg flat on the ground, through the door. To start, position your bottom farther away from the wall than the picture shows.  Keep your right / left knee straight. If you feel a stretch behind your knee or thigh, hold this position for __________ seconds.  If you do not feel a stretch, scoot your bottom closer to the door, and hold for __________ seconds. Repeat __________ times. Complete this stretch __________ times per day.  STRETCH - Knee Extension Sitting  Sit with your right / left leg or heel propped on another chair, coffee table, or foot stool.  Allow your leg muscles to relax, letting gravity straighten out your knee.*  You should feel a stretch behind your right / left knee. Hold this position for __________ seconds. Repeat __________ times. Complete this stretch __________ times per day.  *Your physician, physical therapist or athletic trainer may instruct you to place a __________ weight on your thigh, just above your kneecap, to deepen the stretch.  STRETCH - Knee Extension, Prone  Lie on your stomach on a firm surface, such as a bed or countertop. Place your right / left knee and leg just beyond the edge of the surface. You may wish to place a towel under the far end of your right / left thigh for comfort.  Relax your leg muscles and allow gravity to straighten your knee. Your caregiver may advise you to add an ankle weight, if more resistance is helpful for you.  You should  feel a stretch in the back of your right / left knee. Hold this position for __________ seconds. Repeat __________ times. Complete this __________ times per day. STRETCH - Quadriceps, Prone  Lie on your stomach on a firm surface, such as a bed or padded floor.  Bend your right / left knee and grasp your ankle. If you are not able to reach your ankle or pant leg, use a belt around your foot to lengthen your reach.  Gently pull your heel toward your buttocks. Your knee should not slide out to the side. You should feel a stretch in the front of your thigh and knee.  Hold this position for __________ seconds. Repeat __________ times. Complete this stretch __________ times per day.  STRENGTHENING EXERCISES - Tibial Plateau Fracture These exercises may help you when beginning to recover from  your injury. Your symptoms may go away with or without further involvement from your physician, physical therapist or athletic trainer. While completing these exercises, remember:   Muscles can gain both the endurance and the strength needed for everyday activities through controlled exercises.  Complete these exercises as instructed by your physician, physical therapist or athletic trainer. Increase the resistance and repetitions only as guided. STRENGTH - Quadriceps, Isometrics  Lie on your back with your right / left leg extended and your opposite knee bent.  Gradually tense the muscles in the front of your right / left thigh. You should see either your knee cap slide up toward your hip or increased dimpling just above the knee. This motion will push the back of the knee down toward the floor, mat, or bed on which you are lying.  Hold the muscle as tight as you can, without increasing your pain, for __________ seconds.  Relax the muscles slowly and completely between each repetition. Repeat __________ times. Complete this exercise __________ times per day.  STRENGTH - Quadriceps, Short Arcs  Lie on  your back. Place a __________ inch towel roll under your knee, so that the knee bends slightly.  Raise only your lower leg, by tightening the muscles in the front of your thigh. Do not allow your thigh to rise.  Hold this position for __________ seconds. Repeat __________ times. Complete this exercise __________ times per day.  OPTIONAL ANKLE WEIGHTS: Begin with ____________________, but DO NOT exceed ____________________. Increase in 1 pound/ 0.5 kilogram increments. STRENGTH - Quadriceps, Straight Leg Raises Quality counts! Watch for signs that the quadriceps muscle is working, to make sure you are strengthening the correct muscles and not "cheating" by substituting with healthier muscles.  Lay on your back with your right / left leg extended and your opposite knee bent.  Tense the muscles in the front of your right / left thigh. You should see your knee cap slide up, or see increased dimpling just above the knee. Your thigh may even shake a bit.  Tighten these muscles even more, and raise your leg 4 to 6 inches off the floor. Hold for __________ seconds.  Keeping these muscles tense, lower your leg.  Relax the muscles slowly and completely between each repetition. Repeat __________ times. Complete this exercise __________ times per day.  STRENGTH - Hamstring, Isometrics   Lie on your back, on a firm surface.  Bend your right / left knee approximately __________ degrees.  Dig your heel into the surface, as if you are trying to pull it toward your buttocks. Tighten the muscles in the back of your thighs to "dig" as hard as you can, without increasing any pain.  Hold this position for __________ seconds.  Release the tension gradually and allow your muscle to completely relax for __________ seconds between each exercise. Repeat __________ times. Complete this exercise __________ times per day.  STRENGTH - Hamstring, Curls  Lay on your stomach, with your legs extended. (If you lay on  a bed, your feet may hang over the edge.)  Tighten the muscles in the back of your thigh to bend your right / left knee up to 90 degrees. Keep your hips flat on the bed or floor.  Hold this position for __________ seconds.  Slowly lower your leg back to the starting position. Repeat __________ times. Complete this exercise __________ times per day.  OPTIONAL ANKLE WEIGHTS: Begin with ____________________, but DO NOT exceed ____________________. Increase in __________ increments.  STRENGTH -  Hip Abductors, Straight Leg Raises Be aware of your form throughout the entire exercise, so that you exercise the correct muscles. Poor form means that you are not strengthening the correct muscles.  Lie on your side, so that your head, shoulders, knee and hip line up. You may bend your lower knee, to help maintain your balance. Your right / left leg should be on top.  Roll your hips slightly forward, so that your hips are stacked directly over each other, and your right / left knee is facing forward.  Lift your top leg up, 4-6 inches, leading with your heel. Be sure that your foot does not drift forward or that your knee does not roll toward the ceiling.  Hold this position for __________ seconds. You should feel the muscles in your outer hip lifting (you may not notice this until your leg begins to tire).  Slowly lower your leg to the starting position. Allow the muscles to fully relax before starting the next repetition. Repeat __________ times. Complete this exercise __________ times per day.  STRENGTH - Hip Extensors, Bridge   Lie on your back, on a firm surface. Bend your knees and place your feet flat on the floor.  Tighten your buttocks muscles and lift your bottom off the floor, until your trunk is level with your thighs. You should feel the muscles in your buttocks and the back of your thighs working. If you do not feel these muscles, slide your feet 1-2 inches further away from your  buttocks.  Hold this position for __________ seconds.  Slowly lower your hips to the starting position, and allow your buttock muscles to relax completely before starting the next repetition.  If this exercise is too easy, you may cross your arms over your chest. Repeat __________ times. Complete this exercise __________ times per day.  STRENGTH  Quadriceps, Squats  Stand in a door frame, so that your feet and knees are in line with the frame.  Use your hands for balance, not support, on the frame.  Slowly lower your weight, bending at the hips and knees. Keep your lower legs (below the knee) upright, so that they are parallel with the door frame. Squat only within a range that does not increase your knee pain. Never let your hips drop below your knees.  Slowly return to upright, pushing with your legs, not pulling with your hands. Repeat __________ times. Complete this exercise __________ times per day.  Document Released: 08/03/2005 Document Revised: 10/26/2011 Document Reviewed: 11/15/2008 [facility] Patient Information 2014 [place], [place].   Emergency Department Resource Guide 1) Find a Doctor and Pay Out of Pocket Although you won't have to find out who is covered by your insurance plan, it is a good idea to ask around and get recommendations. You will then need to call the office and see if the doctor you have chosen will accept you as a new patient and what types of options they offer for patients who are self-pay. Some doctors offer discounts or will set up payment plans for their patients who do not have insurance, but you will need to ask so you aren't surprised when you get to your appointment.  2) Contact Your Local Health Department Not all health departments have doctors that can see patients for sick visits, but many do, so it is worth a call to see if yours does. If you don't know where your local health department is, you can check in your phone book. The CDC also  has a  tool to help you locate your state's health department, and many state websites also have listings of all of their local health departments.  3) Find a Walk-in Clinic If your illness is not likely to be very severe or complicated, you may want to try a walk in clinic. These are popping up all over the country in pharmacies, drugstores, and shopping centers. They're usually staffed by nurse practitioners or physician assistants that have been trained to treat common illnesses and complaints. They're usually fairly quick and inexpensive. However, if you have serious medical issues or chronic medical problems, these are probably not your best option.  No Primary Care Doctor: - Call Health Connect at  [phone] - they can help you locate a primary care doctor that  accepts your insurance, provides certain services, etc. - Physician Referral Service- [phone]  Chronic Pain Problems: Organization         Address  Phone   Notes  [facility]  [phone] Patients need to be referred by their primary care doctor.   Medication Assistance: Organization         Address  Phone   Notes  [facility] Medication [facility] [address] [place] [place], [place] [address] [phone] --Must be a resident of [facility] -- Must have NO insurance coverage whatsoever (no Medicaid/ Medicare, etc.) -- The pt. MUST have a primary care doctor that directs their care regularly and follows them in the community   [facility]  [phone]   [employer]  [phone]    Agencies that provide inexpensive medical care: Organization         Address  Phone   Notes  [facility]  [phone]   [facility]    [phone]   [facility] [address] [place], [place] [address] [phone]   [facility] [address] [place] [phone]   Planned Parenthood    [phone]    [facility]    [phone]   [facility]  [address], [place] Phone:  [phone], Fax:  [phone] Hours of Operation:  9 am - 6 pm, M-F.  Also accepts Medicaid/Medicare and self-pay.  [facility]  [address], [place] Phone: [phone], Fax: [phone]. Hours of Operation:  8:30 am - 5:30 pm, M-F.  Also accepts Medicaid and self-pay.  [facility] [place] [address], [place] Phone: [phone]   [facility] [address] Natasha Bence [place], [place] [phone], Ext. [phone] Mondays & Thursdays: 7-9 AM.  First 15 patients are seen on a first come, first serve basis.    Medicaid-accepting [facility] Providers:  Organization         Address  Phone   Notes  [facility] [address], [place] [phone] Also accepts self-pay patients.  [facility] [address] Laurell Josephs [place], [place]  [phone]   [facility] [address], [place] [phone]   [facility] [address], [place] [phone]   Renaye Rakers [address], [place]   [phone] Only accepts [place] patients after they have their name applied to their card.   Self-Pay (no insurance) in  [facility]:  Organization         Address  Phone   Notes  Sickle Cell Patients, [facility] [address] [place], [place] [phone]   [facility] [address] [place], [place] [phone]   [facility] [place]  [address], [place] [phone]   [facility]/Dr. Osei-Bonsu  [address], [place] or [address], [place] [phone] Phone number for both [place] and [place] locations is the same.  [facility] [address] [place] [phone]   [facility] [address], [place] or [address] [phone]   [facility] [address] [place] [phone], phone; [phone], fax Sees patients 1st and 3rd Saturday of every month.  Must not qualify for public or private insurance (i.e. Medicaid, Medicare, [place] Health Choice, Veterans' Benefits)  Household income should be no more than 200% of the poverty level The clinic cannot treat you if you are pregnant or think you are pregnant  Sexually transmitted diseases are not treated at the clinic.    Dental Care: Organization         Address  Phone  Notes  [facility] Department of [facility] [address] [place], [place] [phone] Accepts children up to age 37 who are enrolled in [place] or [place] Health Choice; pregnant women with a Medicaid card; and children who have applied for Medicaid or [place] Health Choice, but were declined, whose parents can pay a reduced fee at time of service.  [facility] Department of [facility]  [address], [place] [phone] Accepts children up to age 42 who are enrolled in [place] or [place] Health Choice; pregnant women with a Medicaid card; and children who have applied for Medicaid or [place] Health Choice, but were declined, whose parents can pay a reduced fee at time of service.  [facility]  [address] [place], [place] [phone] Patients are seen by appointment only. Walk-ins are not accepted. [facility] will see patients 61 years of age and older. Monday - Tuesday (8am-5pm) Most Wednesdays (8:30-5pm) $30 per visit, cash only  [facility]  [address], [facility] [phone] Patients are seen by appointment only. Walk-ins are not accepted. [facility] will see patients 63 years of age and older. One Wednesday  Evening (Monthly: Volunteer Based).  $30 per visit, cash only  [employer]  [phone] for adults; Children under age 9, call Graduate Pediatric Dentistry at [phone]. Children aged 71-14, please call [phone] to request a pediatric application.  Dental services are provided in all areas of dental care including fillings, crowns and bridges, complete and partial dentures, implants, gum treatment, root canals, and extractions. Preventive care is also provided. Treatment is provided to both adults and children. Patients are selected via a lottery and there is often a waiting list.   [facility] [address], [place]  [phone] [email or web address]   [facility] [address] [place], [place] [phone], Ext. [phone] Second and Fourth Thursday of each month, opens at 6:30 AM; Clinic ends at 9 AM.  Patients are seen on a first-come first-served basis, and a limited number are seen during each  clinic.   [facility]  [address], [place], [place] [phone]   Eligibility Requirements You must have lived in [place], [place], or [place] for at least the last three months.   You cannot be eligible for state or federal sponsored National Cityhealthcare insurance, including CIG[employer]eterans Administration, [place]icaid, or [employer] EntertainmentMedicare.   You generally cannot be eligible for healthcare insurance through your employer.    How to apply: Eligibility screenings are held every Tuesday and Wednesday afternoon from 1:00 pm until 4:00 pm. You do not need an appointment for the interview!  [facility] [address], [place], [place] [phone]   [facility]  [phone]   [facility]  [phone]   [facility]  [phone]    Behavioral Health Resources in the Community: Intensive Outpatient Programs Organization         Address  Phone  Notes  [facility] [address], [place], [place] [phone]   [facility] [address], [place], [place] [phone]   [facility] [address], [place], [place]  [phone]   [facility] [address],  [place], [place] [phone] or [phone]   Substance Abuse Resources Organization         Address  Phone  Notes  [facility]  [phone]   [facility]  [phone]   [facility]  [phone]   [facility]  [phone]   Residential & Outpatient Substance Abuse Program  [phone]   Psychological Services Organization         Address  Phone  Notes  [facility]  [phone]   [facility]  [phone]   [facility] [address], [place] [phone] or [phone]    Mobile Crisis Teams Organization         Address  Phone  Notes  [facility], Mobile Crisis Care Unit  [phone]   [facility]  [address] [place], [place] [phone]   Doristine LocksSharon DeEsch [address] [place] [phone]    Self-Help/Support Groups Organization         Address  Phone             Notes  [facility] [place] - variety of support groups  [phone] Call for more information  Narcotics Anonymous (NA), [facility] [address], [place]  2 meetings at this location   [facility] Treatment Programs Organization         Address  Phone  Notes  [facility] [address],    [place]  [phone]   [facility]  [address], [place] [phone], [place], [place] [phone]   [facility] [address], [place] [phone] Admissions: 8am-3pm M-F  [facility] [address]    [place], [place] [phone]   [facility] [address],  [place], [place] [phone]   [facility] [address]  [place], [place] [phone]   [facility] - Intensive Outpatient [address], [place], [place] [phone]   [facility] [address]  [place], [place] [phone] or [phone]   [facility] ([facility]) [address] [place], [place] [phone] Accepts Medicaid  [facility] [address]  [place]  [phone] Substance Abuse/Addiction Treatment   [facility] Organization         Address  Phone  Notes  [facility]  [phone]   Domenic Schwab, PhD [address] [place] [place], [place]   [phone] or [phone]   [facility] [place], [place] [phone]   [address], [place], [place] [phone] Insurance/Medicaid/sponsorship through [facility] and Families [address], [address] [place]                                    [place], [place] [phone] [address], [place] [phone]    Dr. Adele Schilder  [phone]   [facility] 1) [address], [place] 2) [place] 3)  [address], [place] [phone]  [phone]   [place] [phone] or [phone] (After Hours)

## 2013-12-27 NOTE — ED Notes (Signed)
Care management in with pt.
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
## 2013-12-30 NOTE — ED Provider Notes (Signed)
Medical screening examination/treatment/procedure(s) were performed by non-physician practitioner and as supervising physician I was immediately available for consultation/collaboration.    Avigail Pilling R Takoya Jonas, MD 12/30/13 0709 

## 2014-02-15 ENCOUNTER — Emergency Department (HOSPITAL_COMMUNITY)
Admission: EM | Admit: 2014-02-15 | Discharge: 2014-02-15 | Disposition: A | Discharge status: Home / Self Care | Payer: Self-pay | Attending: Emergency Medicine | Admitting: Emergency Medicine

## 2014-02-15 ENCOUNTER — Encounter (HOSPITAL_COMMUNITY): Payer: Self-pay | Admitting: Emergency Medicine

## 2014-02-15 DIAGNOSIS — Z87891 Personal history of nicotine dependence: Secondary | ICD-10-CM | POA: Insufficient documentation

## 2014-02-15 DIAGNOSIS — L02412 Cutaneous abscess of left axilla: Secondary | ICD-10-CM

## 2014-02-15 DIAGNOSIS — Z792 Long term (current) use of antibiotics: Secondary | ICD-10-CM | POA: Insufficient documentation

## 2014-02-15 DIAGNOSIS — IMO0002 Reserved for concepts with insufficient information to code with codable children: Secondary | ICD-10-CM | POA: Insufficient documentation

## 2014-02-15 NOTE — ED Notes (Addendum)
Pt state he has abscess under his left arm

## 2014-02-15 NOTE — ED Notes (Signed)
Pt has abscess to L axillary, states has history of the same. No active draining at present. Abscess width of golf ball.

## 2014-02-15 NOTE — ED Provider Notes (Signed)
Medical screening examination/treatment/procedure(s) were performed by non-physician practitioner and as supervising physician I was immediately available for consultation/collaboration.   EKG Interpretation None        Schon Zeiders, MD 02/15/14 2320 

## 2014-02-15 NOTE — ED Provider Notes (Signed)
CSN: [id number]     Arrival date & time 02/15/14  1815 History  This chart was scribed for non-physician practitioner, Garlon HatchetLisa M Sanders, PA-C, working with Rolan BuccoMelanie Belfi, MD, by Bronson CurbJacqueline Melvin, ED Scribe. This patient was seen in room TR07C/TR07C and the patient's care was started at 6:43 PM.    Chief Complaint  Patient presents with  . Abscess    The history is provided by the patient. No language interpreter was used.    HPI Comments: Chase Greer is a 23 y.o. male who presents to the Emergency Department complaining of abscess under left arm that appeared 4 days ago. Patient reports history of abscesses. He denies any pain, drainage, fever, or chills. Patient denies history of MRSA, or any other significant health conditions.  No intervention tried PTA.   History reviewed. No pertinent past medical history. History reviewed. No pertinent past surgical history. No family history on file. History  Substance Use Topics  . Smoking status: Former Games developermoker  . Smokeless tobacco: Not on file  . Alcohol Use: Yes     Comment: occ    Review of Systems  Constitutional: Negative for fever and chills.  Skin:       Abscess (under left arm)  All other systems reviewed and are negative.     Allergies  Review of patient's allergies indicates no known allergies.  Home Medications   Prior to Admission medications   Medication Sig Start Date End Date Taking? Authorizing Provider  amoxicillin-clavulanate (AUGMENTIN) 875-125 MG per tablet Take 1 tablet by mouth 2 (two) times daily. One po bid x 7 days 12/24/13   Trevor Maceobyn M Albert, PA-C  HYDROcodone-acetaminophen (NORCO/VICODIN) 5-325 MG per tablet Take 1 tablet by mouth every 6 (six) hours as needed. 12/27/13   Marissa Sciacca, PA-C   Triage Vitals: BP 123/73  Pulse 73  Temp(Src) 98 F (36.7 C) (Oral)  Resp 18  SpO2 100%  Physical Exam  Nursing note and vitals reviewed. Constitutional: He is oriented to person, place, and time. He  appears well-developed and well-nourished. No distress.  HENT:  Head: Normocephalic and atraumatic.  Mouth/Throat: Oropharynx is clear and moist.  Eyes: Conjunctivae and EOM are normal. Pupils are equal, round, and reactive to light.  Neck: Normal range of motion. Neck supple.  Cardiovascular: Normal rate, regular rhythm and normal heart sounds.   Pulmonary/Chest: Effort normal and breath sounds normal. No respiratory distress. He has no wheezes.  Musculoskeletal: Normal range of motion.  Neurological: He is alert and oriented to person, place, and time.  Skin: Skin is warm and dry. He is not diaphoretic.  2cm in diameter abscess under left axilla; no surrounding erythema, induration, or signs of cellulitis; central fluctuance without active drainage  Psychiatric: He has a normal mood and affect.    ED Course  Procedures (including critical care time)  DIAGNOSTIC STUDIES: Oxygen Saturation is 100% on room air, normal by my interpretation.    COORDINATION OF CARE: At [facility] Discussed treatment plan with patient which includes I&D. Patient agrees.    INCISION AND DRAINAGE PROCEDURE NOTE: Patient identification was confirmed and verbal consent was obtained. This procedure was performed by PA Student under by direct supervision at 6:46 PM. Site: left axilla Sterile procedures observed yes Needle size: 25 Anesthetic used (type and amt): 1% lidocaine with epinephrine Blade size: 11 blade Drainage: large, purulent Complexity: Complex Packing used none Site anesthetized, incision made over site, wound drained and explored loculations, rinsed with copious amounts of normal saline,  wound packed with sterile gauze, covered with dry, sterile dressing.  Pt tolerated procedure well without complications.  Instructions for care discussed verbally and pt provided with additional written instructions for homecare and f/u.   Labs Review Labs Reviewed - No data to display  Imaging  Review No results found.   EKG Interpretation None      MDM   Final diagnoses:  Abscess of left axilla   Axilla I&D'd, pt tolerated well.  Wound was dressed.  Instructed on home wound care and recommended warm compresses.  Discussed plan with patient, he/she acknowledged understanding and agreed with plan of care.  Return precautions given for new or worsening symptoms.  I personally performed the services described in this documentation, which was scribed in my presence. The recorded information has been reviewed and is accurate.   Garlon HatchetLisa M Sanders, PA-C 02/15/14 1921

## 2014-02-15 NOTE — Discharge Instructions (Signed)
Warm compresses at home over area of incision to help aid drainage. Monitor for increased redness, swelling, high fever, etc-- if these occur return to the ED.

## 2014-02-15 NOTE — ED Notes (Signed)
Pt reports abscess under left arm x 4 days. Denies drainage. Denies fever/chills. Hx of abscesses.

## 2015-07-17 ENCOUNTER — Encounter (HOSPITAL_COMMUNITY): Payer: Self-pay | Admitting: Emergency Medicine

## 2015-07-17 ENCOUNTER — Emergency Department (HOSPITAL_COMMUNITY)
Admission: EM | Admit: 2015-07-17 | Discharge: 2015-07-17 | Disposition: A | Discharge status: Home / Self Care | Payer: No Typology Code available for payment source | Attending: Emergency Medicine | Admitting: Emergency Medicine

## 2015-07-17 DIAGNOSIS — Y92410 Unspecified street and highway as the place of occurrence of the external cause: Secondary | ICD-10-CM | POA: Insufficient documentation

## 2015-07-17 DIAGNOSIS — M62838 Other muscle spasm: Secondary | ICD-10-CM | POA: Diagnosis not present

## 2015-07-17 DIAGNOSIS — S199XXA Unspecified injury of neck, initial encounter: Secondary | ICD-10-CM | POA: Diagnosis present

## 2015-07-17 DIAGNOSIS — S161XXA Strain of muscle, fascia and tendon at neck level, initial encounter: Secondary | ICD-10-CM | POA: Diagnosis not present

## 2015-07-17 DIAGNOSIS — Y9389 Activity, other specified: Secondary | ICD-10-CM | POA: Insufficient documentation

## 2015-07-17 DIAGNOSIS — Y998 Other external cause status: Secondary | ICD-10-CM | POA: Diagnosis not present

## 2015-07-17 DIAGNOSIS — Z87891 Personal history of nicotine dependence: Secondary | ICD-10-CM | POA: Diagnosis not present

## 2015-07-17 MED ORDER — NAPROXEN 500 MG PO TABS
500.0000 mg | ORAL_TABLET | Freq: Once | ORAL | Status: AC
  Administered 2015-07-17: 500 mg via ORAL
  Filled 2015-07-17: qty 1

## 2015-07-17 MED ORDER — CYCLOBENZAPRINE HCL 10 MG PO TABS
10.0000 mg | ORAL_TABLET | Freq: Once | ORAL | Status: AC
  Administered 2015-07-17: 10 mg via ORAL
  Filled 2015-07-17: qty 1

## 2015-07-17 MED ORDER — CYCLOBENZAPRINE HCL 10 MG PO TABS: 10.0000 mg | ORAL_TABLET | Freq: Three times a day (TID) | ORAL | Status: DC | PRN

## 2015-07-17 MED ORDER — NAPROXEN 500 MG PO TABS: 500.0000 mg | ORAL_TABLET | Freq: Two times a day (BID) | ORAL | Status: DC | PRN
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
## 2015-07-17 NOTE — ED Notes (Signed)
Patient was alert, oriented and stable upon discharge. RN went over AVS and patient had no further questions.  

## 2015-07-25 ENCOUNTER — Emergency Department (INDEPENDENT_AMBULATORY_CARE_PROVIDER_SITE_OTHER)
Admission: EM | Admit: 2015-07-25 | Discharge: 2015-07-25 | Disposition: A | Discharge status: Home / Self Care | Payer: Self-pay | Source: Home / Self Care | Attending: Family Medicine | Admitting: Family Medicine

## 2015-07-25 ENCOUNTER — Encounter (HOSPITAL_COMMUNITY): Payer: Self-pay

## 2015-07-25 DIAGNOSIS — M542 Cervicalgia: Secondary | ICD-10-CM

## 2015-07-25 DIAGNOSIS — M549 Dorsalgia, unspecified: Secondary | ICD-10-CM

## 2015-07-25 MED ORDER — KETOROLAC TROMETHAMINE 30 MG/ML IJ SOLN
30.0000 mg | Freq: Once | INTRAMUSCULAR | Status: AC
  Administered 2015-07-25: 30 mg via INTRAMUSCULAR

## 2015-07-25 MED ORDER — KETOROLAC TROMETHAMINE 30 MG/ML IJ SOLN
INTRAMUSCULAR | Status: AC
  Filled 2015-07-25: qty 1

## 2015-07-25 MED ORDER — METHOCARBAMOL 500 MG PO TABS: 500.0000 mg | ORAL_TABLET | Freq: Three times a day (TID) | ORAL | Status: DC

## 2015-07-25 MED ORDER — KETOROLAC TROMETHAMINE 10 MG PO TABS: 10.0000 mg | ORAL_TABLET | Freq: Three times a day (TID) | ORAL | Status: DC

## 2015-07-25 NOTE — Discharge Instructions (Signed)
Call to see orthopedist on Monday, use medicine as prescribed, work as instructed by orthopedist.
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
## 2016-01-10 ENCOUNTER — Emergency Department (HOSPITAL_COMMUNITY): Payer: No Typology Code available for payment source

## 2016-01-10 ENCOUNTER — Emergency Department (HOSPITAL_COMMUNITY)
Admission: EM | Admit: 2016-01-10 | Discharge: 2016-01-11 | Disposition: A | Discharge status: Home / Self Care | Payer: No Typology Code available for payment source | Attending: Emergency Medicine | Admitting: Emergency Medicine

## 2016-01-10 ENCOUNTER — Encounter (HOSPITAL_COMMUNITY): Payer: Self-pay | Admitting: Oncology

## 2016-01-10 DIAGNOSIS — S3992XA Unspecified injury of lower back, initial encounter: Secondary | ICD-10-CM | POA: Diagnosis present

## 2016-01-10 DIAGNOSIS — Z87891 Personal history of nicotine dependence: Secondary | ICD-10-CM | POA: Diagnosis not present

## 2016-01-10 DIAGNOSIS — S161XXA Strain of muscle, fascia and tendon at neck level, initial encounter: Secondary | ICD-10-CM | POA: Diagnosis not present

## 2016-01-10 DIAGNOSIS — Y92410 Unspecified street and highway as the place of occurrence of the external cause: Secondary | ICD-10-CM | POA: Diagnosis not present

## 2016-01-10 DIAGNOSIS — Y999 Unspecified external cause status: Secondary | ICD-10-CM | POA: Insufficient documentation

## 2016-01-10 DIAGNOSIS — Y939 Activity, unspecified: Secondary | ICD-10-CM | POA: Insufficient documentation

## 2016-01-10 DIAGNOSIS — S39012A Strain of muscle, fascia and tendon of lower back, initial encounter: Secondary | ICD-10-CM

## 2016-01-10 MED ORDER — METHOCARBAMOL 500 MG PO TABS: 500.0000 mg | ORAL_TABLET | Freq: Three times a day (TID) | ORAL | Status: DC | PRN

## 2016-01-10 MED ORDER — NAPROXEN 500 MG PO TABS: 500.0000 mg | ORAL_TABLET | Freq: Two times a day (BID) | ORAL | Status: DC | PRN

## 2016-01-10 MED ORDER — NAPROXEN 500 MG PO TABS
500.0000 mg | ORAL_TABLET | Freq: Once | ORAL | Status: AC
  Administered 2016-01-10: 500 mg via ORAL
  Filled 2016-01-10: qty 1
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
## 2016-01-10 NOTE — ED Notes (Signed)
Pt ambulatory to XR without difficulty at this time.
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
## 2016-01-10 NOTE — ED Notes (Signed)
Kelly, PA at bedside at this time.  

## 2016-01-11 NOTE — ED Notes (Signed)
Pt given discharge instructions, verbalized understanding of need to follow up, medications to take at home and reasons to return to the ED. Pt states pain decreased "a little." Pt denied further questions or concerns upon discharge. Pt ambulatory to exit with RN as escort.

## 2016-03-26 ENCOUNTER — Emergency Department (HOSPITAL_COMMUNITY): Payer: No Typology Code available for payment source

## 2016-03-26 ENCOUNTER — Encounter (HOSPITAL_COMMUNITY): Payer: Self-pay

## 2016-03-26 ENCOUNTER — Emergency Department (HOSPITAL_COMMUNITY)
Admission: EM | Admit: 2016-03-26 | Discharge: 2016-03-26 | Disposition: A | Discharge status: Home / Self Care | Payer: No Typology Code available for payment source | Attending: Emergency Medicine | Admitting: Emergency Medicine

## 2016-03-26 DIAGNOSIS — Y999 Unspecified external cause status: Secondary | ICD-10-CM | POA: Diagnosis not present

## 2016-03-26 DIAGNOSIS — Z87891 Personal history of nicotine dependence: Secondary | ICD-10-CM | POA: Insufficient documentation

## 2016-03-26 DIAGNOSIS — Y92410 Unspecified street and highway as the place of occurrence of the external cause: Secondary | ICD-10-CM | POA: Diagnosis not present

## 2016-03-26 DIAGNOSIS — S62102A Fracture of unspecified carpal bone, left wrist, initial encounter for closed fracture: Secondary | ICD-10-CM | POA: Diagnosis not present

## 2016-03-26 DIAGNOSIS — Y939 Activity, unspecified: Secondary | ICD-10-CM | POA: Diagnosis not present

## 2016-03-26 DIAGNOSIS — S6992XA Unspecified injury of left wrist, hand and finger(s), initial encounter: Secondary | ICD-10-CM | POA: Diagnosis present

## 2016-03-26 DIAGNOSIS — M25532 Pain in left wrist: Secondary | ICD-10-CM

## 2016-03-26 MED ORDER — HYDROCODONE-ACETAMINOPHEN 5-325 MG PO TABS
1.0000 | ORAL_TABLET | Freq: Once | ORAL | Status: AC
  Administered 2016-03-26: 1 via ORAL
  Filled 2016-03-26: qty 1

## 2016-03-26 MED ORDER — NAPROXEN 500 MG PO TABS: 500.0000 mg | ORAL_TABLET | Freq: Two times a day (BID) | ORAL | 0 refills | Status: DC | PRN

## 2016-03-26 MED ORDER — HYDROCODONE-ACETAMINOPHEN 5-325 MG PO TABS: 1.0000 | ORAL_TABLET | Freq: Four times a day (QID) | ORAL | 0 refills | Status: DC | PRN

## 2016-03-26 MED FILL — HYDROCODON-APAP 5-325: 5-325 | 3 days supply | Qty: 10 | Fill #0

## 2016-03-26 NOTE — Progress Notes (Signed)
Orthopedic Tech Progress Note Patient Details:  Chase Greer 12/08/1990 [id number]  Ortho Devices Type of Ortho Device: Ace wrap, Arm sling, Volar splint Ortho Device/Splint Location: lue Ortho Device/Splint Interventions: Application   Merlene Dante 03/26/2016, 10:03 AM

## 2016-03-26 NOTE — ED Notes (Signed)
Ortho .tec. At bed side
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
## 2016-03-26 NOTE — ED Notes (Signed)
Declined W/C at D/C and was escorted to lobby by RN.Declined W/C at D/C and was escorted to lobby by RN. 

## 2016-03-26 NOTE — Discharge Instructions (Signed)
Wear wrist splint at all times until you see the hand specialist. Ice and elevate wrist throughout the day, using ice pack for no more than 20 minutes every hour.  Alternate between naprosyn and norco for pain relief. Do not drive or operate machinery with pain medication use. Call hand specialist follow up today or tomorrow to schedule followup appointment for recheck of ongoing wrist pain in 1 week. Return to the ER for changes or worsening symptoms.
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
## 2016-04-07 ENCOUNTER — Emergency Department (HOSPITAL_COMMUNITY)
Admission: EM | Admit: 2016-04-07 | Discharge: 2016-04-07 | Disposition: A | Discharge status: Home / Self Care | Payer: No Typology Code available for payment source

## 2016-04-07 NOTE — ED Triage Notes (Signed)
Pt states he wants to leave. He does not wish to wait to be seen. RN advised him against this and encouraged pt to stay. Educated pt of the risks.

## 2016-04-08 ENCOUNTER — Encounter (HOSPITAL_COMMUNITY): Payer: Self-pay

## 2016-04-08 ENCOUNTER — Emergency Department (HOSPITAL_COMMUNITY)
Admission: EM | Admit: 2016-04-08 | Discharge: 2016-04-08 | Disposition: A | Discharge status: Home / Self Care | Payer: No Typology Code available for payment source | Attending: Emergency Medicine | Admitting: Emergency Medicine

## 2016-04-08 DIAGNOSIS — M542 Cervicalgia: Secondary | ICD-10-CM | POA: Diagnosis present

## 2016-04-08 DIAGNOSIS — Y939 Activity, unspecified: Secondary | ICD-10-CM | POA: Insufficient documentation

## 2016-04-08 DIAGNOSIS — Y999 Unspecified external cause status: Secondary | ICD-10-CM | POA: Insufficient documentation

## 2016-04-08 DIAGNOSIS — Y92410 Unspecified street and highway as the place of occurrence of the external cause: Secondary | ICD-10-CM | POA: Insufficient documentation

## 2016-04-08 DIAGNOSIS — Z87891 Personal history of nicotine dependence: Secondary | ICD-10-CM | POA: Diagnosis not present
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
## 2016-04-08 NOTE — Discharge Instructions (Signed)
For your back and neck pain, begin taking 600-800 mg of ibuprofen every 6 hours (four times a day) whether you are in pain or not for the next 5 days. It is also very important to stay as active as possible. Be sure to stretch your arms, neck, and back and move around as much as you can tolerate.
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
## 2016-06-06 ENCOUNTER — Emergency Department (HOSPITAL_COMMUNITY): Payer: No Typology Code available for payment source

## 2016-06-06 ENCOUNTER — Emergency Department (HOSPITAL_COMMUNITY)
Admission: EM | Admit: 2016-06-06 | Discharge: 2016-06-06 | Disposition: A | Discharge status: Home / Self Care | Payer: No Typology Code available for payment source | Attending: Emergency Medicine | Admitting: Emergency Medicine

## 2016-06-06 ENCOUNTER — Encounter (HOSPITAL_COMMUNITY): Payer: Self-pay | Admitting: *Deleted

## 2016-06-06 DIAGNOSIS — Y9389 Activity, other specified: Secondary | ICD-10-CM | POA: Diagnosis not present

## 2016-06-06 DIAGNOSIS — Y92410 Unspecified street and highway as the place of occurrence of the external cause: Secondary | ICD-10-CM | POA: Diagnosis not present

## 2016-06-06 DIAGNOSIS — Y999 Unspecified external cause status: Secondary | ICD-10-CM | POA: Diagnosis not present

## 2016-06-06 DIAGNOSIS — S4991XA Unspecified injury of right shoulder and upper arm, initial encounter: Secondary | ICD-10-CM | POA: Insufficient documentation

## 2016-06-06 DIAGNOSIS — Z87891 Personal history of nicotine dependence: Secondary | ICD-10-CM | POA: Insufficient documentation

## 2016-06-06 DIAGNOSIS — M25562 Pain in left knee: Secondary | ICD-10-CM

## 2016-06-06 DIAGNOSIS — M25511 Pain in right shoulder: Secondary | ICD-10-CM

## 2016-06-06 MED ORDER — OXYCODONE-ACETAMINOPHEN 5-325 MG PO TABS
1.0000 | ORAL_TABLET | Freq: Once | ORAL | Status: AC
  Administered 2016-06-06: 1 via ORAL
  Filled 2016-06-06: qty 1

## 2016-06-06 MED ORDER — OXYCODONE-ACETAMINOPHEN 5-325 MG PO TABS: 1.0000 | ORAL_TABLET | Freq: Four times a day (QID) | ORAL | 0 refills | Status: DC | PRN
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
## 2016-06-06 NOTE — ED Notes (Signed)
Report given to Jennifer RN
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
## 2016-06-11 ENCOUNTER — Ambulatory Visit (INDEPENDENT_AMBULATORY_CARE_PROVIDER_SITE_OTHER): Payer: Self-pay | Admitting: Sports Medicine

## 2016-06-11 ENCOUNTER — Encounter (INDEPENDENT_AMBULATORY_CARE_PROVIDER_SITE_OTHER): Payer: Self-pay | Admitting: Sports Medicine

## 2016-06-11 VITALS — BP 111/59 | HR 81 | Ht 69.0 in

## 2016-06-11 DIAGNOSIS — S43101A Unspecified dislocation of right acromioclavicular joint, initial encounter: Secondary | ICD-10-CM

## 2016-06-11 DIAGNOSIS — M25562 Pain in left knee: Secondary | ICD-10-CM

## 2016-06-11 DIAGNOSIS — M25462 Effusion, left knee: Secondary | ICD-10-CM

## 2016-06-11 MED ORDER — OXYCODONE-ACETAMINOPHEN 5-325 MG PO TABS: 1.0000 | ORAL_TABLET | Freq: Four times a day (QID) | ORAL | 0 refills | Status: DC | PRN

## 2016-06-11 MED ORDER — DICLOFENAC SODIUM 75 MG PO TBEC: 75.0000 mg | DELAYED_RELEASE_TABLET | Freq: Two times a day (BID) | ORAL | 0 refills | Status: DC

## 2016-06-11 NOTE — Patient Instructions (Signed)
We are ordering an MRI for you today.  The imaging office will be calling you to schedule your appointment after we obtain authorization from your insurance company.  Immediately after you schedule your appointment with them please call our office back ([phone]) to schedule a follow-up appointment with me.  This will need to be at least 24 hours after you have the test done to give radiologist and myself time to review the test.  We will discuss further treatment options at your follow up appointment.   
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
## 2016-06-23 ENCOUNTER — Telehealth (INDEPENDENT_AMBULATORY_CARE_PROVIDER_SITE_OTHER): Payer: Self-pay | Admitting: Orthopaedic Surgery

## 2016-06-23 NOTE — Telephone Encounter (Signed)
Please advise 

## 2016-06-23 NOTE — Telephone Encounter (Signed)
Patient wants to know if he can take the knee brace period. Patient wants to know about RX for Percocet.  Contact Info: [phone]

## 2016-06-24 ENCOUNTER — Other Ambulatory Visit (INDEPENDENT_AMBULATORY_CARE_PROVIDER_SITE_OTHER): Payer: Self-pay | Admitting: Sports Medicine

## 2016-06-24 MED ORDER — OXYCODONE-ACETAMINOPHEN 5-325 MG PO TABS: 1.0000 | ORAL_TABLET | Freq: Four times a day (QID) | ORAL | 0 refills | Status: DC | PRN

## 2016-06-24 NOTE — Telephone Encounter (Signed)
Talked with patient and advised him of message concerning knee brace.

## 2016-06-24 NOTE — Progress Notes (Signed)
Talked with patient and advised him of message concerning Rx and advised him that Rx is ready for pick up at the front desk.  Patient stated that MRI was moved to 07/01/16 and his ofc visit is schedule for 07/02/16.  Would you still like for patient to follow up with you on 07/02/16?  Please Advise. Thank You

## 2016-06-24 NOTE — Progress Notes (Signed)
1 additional refill for Percocet provided. I'm not sure why he is following up with Dr. Magnus IvanBlackman but he should be on my schedule unless he is specifically requesting to see Dr. Magnus IvanBlackman. Please address this when he calls & see if we can get him follow-up on Monday. It looks like his MRI is on Friday.

## 2016-06-24 NOTE — Telephone Encounter (Signed)
I think you saw this guy last.

## 2016-06-24 NOTE — Telephone Encounter (Signed)
Please see other note. He needs to keep his knee brace on until the MRI. Okay to shower & sleep without the knee brace as long as he is pain free.

## 2016-06-25 NOTE — Progress Notes (Signed)
Yes the 16th is good but he is scheduled with Dr. Magnus IvanBlackman, not me.  Please have this changed

## 2016-06-26 ENCOUNTER — Other Ambulatory Visit: Payer: Self-pay

## 2016-06-26 NOTE — Progress Notes (Signed)
Patient appointment has been changed to Dr. Berline Choughigby for follow up

## 2016-07-01 ENCOUNTER — Ambulatory Visit (HOSPITAL_COMMUNITY)
Admission: RE | Admit: 2016-07-01 | Discharge: 2016-07-01 | Disposition: A | Discharge status: Home / Self Care | Payer: Self-pay | Source: Ambulatory Visit | Attending: Sports Medicine | Admitting: Sports Medicine

## 2016-07-01 DIAGNOSIS — X58XXXA Exposure to other specified factors, initial encounter: Secondary | ICD-10-CM | POA: Insufficient documentation

## 2016-07-01 DIAGNOSIS — S83272A Complex tear of lateral meniscus, current injury, left knee, initial encounter: Secondary | ICD-10-CM | POA: Insufficient documentation

## 2016-07-01 DIAGNOSIS — M25462 Effusion, left knee: Secondary | ICD-10-CM

## 2016-07-01 DIAGNOSIS — S82832A Other fracture of upper and lower end of left fibula, initial encounter for closed fracture: Secondary | ICD-10-CM | POA: Insufficient documentation

## 2016-07-01 DIAGNOSIS — S83232A Complex tear of medial meniscus, current injury, left knee, initial encounter: Secondary | ICD-10-CM | POA: Insufficient documentation

## 2016-07-01 DIAGNOSIS — S82145A Nondisplaced bicondylar fracture of left tibia, initial encounter for closed fracture: Secondary | ICD-10-CM | POA: Insufficient documentation

## 2016-07-01 DIAGNOSIS — M25562 Pain in left knee: Secondary | ICD-10-CM

## 2016-07-01 DIAGNOSIS — S83512A Sprain of anterior cruciate ligament of left knee, initial encounter: Secondary | ICD-10-CM | POA: Insufficient documentation

## 2016-07-02 ENCOUNTER — Ambulatory Visit (INDEPENDENT_AMBULATORY_CARE_PROVIDER_SITE_OTHER): Payer: Self-pay | Admitting: Sports Medicine

## 2016-07-02 ENCOUNTER — Ambulatory Visit (INDEPENDENT_AMBULATORY_CARE_PROVIDER_SITE_OTHER): Payer: Self-pay | Admitting: Orthopaedic Surgery

## 2016-07-02 ENCOUNTER — Encounter (INDEPENDENT_AMBULATORY_CARE_PROVIDER_SITE_OTHER): Payer: Self-pay | Admitting: Sports Medicine

## 2016-07-02 VITALS — BP 119/74 | HR 73 | Ht 69.0 in | Wt 180.0 lb

## 2016-07-02 DIAGNOSIS — M25562 Pain in left knee: Secondary | ICD-10-CM

## 2016-07-02 DIAGNOSIS — S83272A Complex tear of lateral meniscus, current injury, left knee, initial encounter: Secondary | ICD-10-CM

## 2016-07-02 DIAGNOSIS — M25462 Effusion, left knee: Secondary | ICD-10-CM

## 2016-07-02 DIAGNOSIS — S838X2A Sprain of other specified parts of left knee, initial encounter: Secondary | ICD-10-CM

## 2016-07-02 DIAGNOSIS — S83512A Sprain of anterior cruciate ligament of left knee, initial encounter: Secondary | ICD-10-CM

## 2016-07-02 MED ORDER — LIDOCAINE HCL 1 % IJ SOLN
5.0000 mL | INTRAMUSCULAR | Status: AC | PRN
  Administered 2016-07-02: 5 mL
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
## 2017-06-03 ENCOUNTER — Emergency Department (HOSPITAL_COMMUNITY): Payer: Worker's Compensation

## 2017-06-03 ENCOUNTER — Encounter (HOSPITAL_COMMUNITY): Payer: Self-pay | Admitting: Emergency Medicine

## 2017-06-03 ENCOUNTER — Emergency Department (HOSPITAL_COMMUNITY)
Admission: EM | Admit: 2017-06-03 | Discharge: 2017-06-03 | Disposition: A | Discharge status: Home / Self Care | Payer: Worker's Compensation | Attending: Emergency Medicine | Admitting: Emergency Medicine

## 2017-06-03 DIAGNOSIS — Z87891 Personal history of nicotine dependence: Secondary | ICD-10-CM | POA: Diagnosis not present

## 2017-06-03 DIAGNOSIS — Y93H3 Activity, building and construction: Secondary | ICD-10-CM | POA: Diagnosis not present

## 2017-06-03 DIAGNOSIS — S6992XA Unspecified injury of left wrist, hand and finger(s), initial encounter: Secondary | ICD-10-CM | POA: Diagnosis present

## 2017-06-03 DIAGNOSIS — S61309A Unspecified open wound of unspecified finger with damage to nail, initial encounter: Secondary | ICD-10-CM

## 2017-06-03 DIAGNOSIS — W312XXA Contact with powered woodworking and forming machines, initial encounter: Secondary | ICD-10-CM | POA: Diagnosis not present

## 2017-06-03 DIAGNOSIS — Y990 Civilian activity done for income or pay: Secondary | ICD-10-CM | POA: Insufficient documentation

## 2017-06-03 DIAGNOSIS — S62635B Displaced fracture of distal phalanx of left ring finger, initial encounter for open fracture: Secondary | ICD-10-CM | POA: Insufficient documentation

## 2017-06-03 DIAGNOSIS — Z23 Encounter for immunization: Secondary | ICD-10-CM | POA: Diagnosis not present

## 2017-06-03 DIAGNOSIS — Y929 Unspecified place or not applicable: Secondary | ICD-10-CM | POA: Insufficient documentation

## 2017-06-03 HISTORY — DX: Other meniscus derangements, unspecified lateral meniscus, left knee: M23.301

## 2017-06-03 HISTORY — DX: Sprain of anterior cruciate ligament of unspecified knee, initial encounter: S83.519A

## 2017-06-03 MED ORDER — ACETAMINOPHEN 325 MG PO TABS: 650.0000 mg | ORAL_TABLET | Freq: Four times a day (QID) | ORAL | Status: DC | PRN

## 2017-06-03 MED ORDER — OXYCODONE HCL 5 MG PO TABS: 5.0000 mg | ORAL_TABLET | Freq: Four times a day (QID) | ORAL | 0 refills | Status: DC | PRN

## 2017-06-03 MED ORDER — BUPIVACAINE HCL (PF) 0.5 % IJ SOLN
20.0000 mL | Freq: Once | INTRAMUSCULAR | Status: AC
  Administered 2017-06-03: 20 mL
  Filled 2017-06-03: qty 20

## 2017-06-03 MED ORDER — TETANUS-DIPHTH-ACELL PERTUSSIS 5-2.5-18.5 LF-MCG/0.5 IM SUSP
0.5000 mL | Freq: Once | INTRAMUSCULAR | Status: AC
  Administered 2017-06-03: 0.5 mL via INTRAMUSCULAR
  Filled 2017-06-03: qty 0.5

## 2017-06-03 MED ORDER — CEFAZOLIN SODIUM-DEXTROSE 2-4 GM/100ML-% IV SOLN
2.0000 g | Freq: Once | INTRAVENOUS | Status: AC
  Administered 2017-06-03: 2 g via INTRAVENOUS
  Filled 2017-06-03: qty 100

## 2017-06-03 MED ORDER — IBUPROFEN 200 MG PO TABS
600.0000 mg | ORAL_TABLET | Freq: Four times a day (QID) | ORAL | Status: DC | PRN
Start: 2017-06-03 — End: 2020-04-20

## 2017-06-03 MED ORDER — CEPHALEXIN 500 MG PO CAPS: 500.0000 mg | ORAL_CAPSULE | Freq: Four times a day (QID) | ORAL | 0 refills | Status: AC

## 2017-06-03 NOTE — Discharge Instructions (Addendum)
Discharge Instructions   Move your fingers as much as possible, making a full fist and fully opening the fist. Elevate your hand to reduce pain & swelling of the digits.  Ice over the operative site may be helpful to reduce pain & swelling.  DO NOT USE HEAT. Leave the dressing in place until you return to our office.  You may shower, but keep the bandage clean & dry.  You may drive a car when you are off of prescription pain medications and can safely control your vehicle with both hands. Our office will call you to arrange follow-up   Please call [phone] during normal business hours or [phone] after hours for any problems. Including the following:  - excessive redness of the incisions - drainage for more than 4 days - fever of more than 101.5 F  *Please note that pain medications will not be refilled after hours or on weekends.\  WORK STATUS: NO OPERATING MACHINERY IF TAKING OXYCODONE OTHERWISE, NO LEFT HAND LIFTING, GRIPPING, GRASPING MORE THAN PAPER/PENCIL TASKS

## 2017-06-03 NOTE — ED Triage Notes (Signed)
Pt requested  IV to be started after he has his x-ray done.

## 2017-06-03 NOTE — ED Triage Notes (Signed)
Pt to ed from work- at [employer] through [employer] ([employer]) pt has laceration to ledt ring finger to tip from a router.

## 2017-06-03 NOTE — ED Notes (Signed)
Declined W/C at D/C and was escorted to lobby by RN. 

## 2017-06-03 NOTE — ED Provider Notes (Signed)
[facility] EMERGENCY DEPARTMENT Provider Note   CSN: [id number] Arrival date & time: 06/03/17  1126     History   Chief Complaint Chief Complaint  Patient presents with  . Extremity Laceration  . worker's comp    HPI   Blood pressure (!) 114/98, pulse (!) 110, temperature 99.4 F (37.4 C), temperature source Oral, resp. rate 18, height 5\' 9"  (1.753 m), weight 63.5 kg (140 lb), SpO2 99 %.  Chase Greer is a 26 y.o. male complaining of trauma to left 4th digit sustained 1hour ago at work when a router accidentally imacted the finger. Unsure last tetanus shot. Pain severe and excaerbated by movemant and palpation   Past Medical History:  Diagnosis Date  . ACL tear   . Meniscus, lateral, derangement, left     Patient Active Problem List   Diagnosis Date Noted  . Left anterior cruciate ligament tear 07/02/2016  . Acute medial meniscal injury of knee, left, initial encounter 07/02/2016  . Complex tear of lateral meniscus of left knee as current injury 07/02/2016    History reviewed. No pertinent surgical history.     Home Medications    Prior to Admission medications   Medication Sig Start Date End Date Taking? Authorizing Provider  acetaminophen (TYLENOL) 325 MG tablet Take 2 tablets (650 mg total) by mouth every 6 (six) hours as needed for mild pain or moderate pain. 06/03/17   Mack Hook, MD  cephALEXin (KEFLEX) 500 MG capsule Take 1 capsule (500 mg total) by mouth 4 (four) times daily. 06/03/17 06/08/17  Mack Hook, MD  ibuprofen (ADVIL) 200 MG tablet Take 3 tablets (600 mg total) by mouth every 6 (six) hours as needed for mild pain or moderate pain. 06/03/17   Mack Hook, MD  oxyCODONE (ROXICODONE) 5 MG immediate release tablet Take 1 tablet (5 mg total) by mouth every 6 (six) hours as needed for severe pain. 06/03/17   Mack Hook, MD    Family History No family history on file.  Social History Social History    Substance Use Topics  . Smoking status: Former Games developer  . Smokeless tobacco: Never Used  . Alcohol use Yes     Comment: occ     Allergies   Patient has no known allergies.   Review of Systems Review of Systems  A complete review of systems was obtained and all systems are negative except as noted in the HPI and PMH.   Physical Exam Updated Vital Signs BP 110/74 (BP Location: Right Arm)   Pulse 70   Temp 99.4 F (37.4 C) (Oral)   Resp 16   Ht 5\' 9"  (1.753 m)   Wt 63.5 kg (140 lb)   SpO2 100%   BMI 20.67 kg/m   Physical Exam  Constitutional: He is oriented to person, place, and time. He appears well-developed and well-nourished. No distress.  HENT:  Head: Normocephalic and atraumatic.  Mouth/Throat: Oropharynx is clear and moist.  Eyes: Pupils are equal, round, and reactive to light. Conjunctivae and EOM are normal.  Neck: Normal range of motion.  Cardiovascular: Normal rate, regular rhythm and intact distal pulses.   Pulmonary/Chest: Effort normal and breath sounds normal.  Abdominal: Soft. There is no tenderness.  Musculoskeletal: Normal range of motion.  Left second digit with avulsion that is irregular to the distal tuft. This affects the nailbed, the germinal matrix does appear to be intact. No bone visible. Good range of motion, sensation grossly intact. No foreign bodies.  Neurological: He is alert and oriented to person, place, and time.  Skin: He is not diaphoretic.  Psychiatric: He has a normal mood and affect.  Nursing note and vitals reviewed.            ED Treatments / Results  Labs (all labs ordered are listed, but only abnormal results are displayed) Labs Reviewed - No data to display  EKG  EKG Interpretation None       Radiology Dg Finger Ring Left  Result Date: 06/03/2017 CLINICAL DATA:  Left ring finger injury. EXAM: LEFT RING FINGER 2+V COMPARISON:  None. FINDINGS: Laceration of the distal aspect of the left fourth distal  phalanx. Fracture of the distal tip of the left fourth distal phalanx. No other fracture or dislocation.  No other soft tissue abnormality. IMPRESSION: 1. Laceration of the distal aspect of the left fourth distal phalanx with an open fracture of the distal tip of the left fourth distal phalanx. Electronically Signed   By: Elige KoHetal  Patel   On: 06/03/2017 13:17    Procedures .Nerve Block Date/Time: 06/03/2017 5:54 PM Performed by: Wynetta EmeryPISCIOTTA, Taber Sweetser Authorized by: Wynetta EmeryPISCIOTTA, Wynelle Dreier   Consent:    Consent obtained:  Verbal   Consent given by:  Patient Indications:    Indications:  Pain relief Location:    Body area:  Upper extremity   Laterality:  Left Pre-procedure details:    Skin preparation:  2% chlorhexidine Procedure details (see MAR for exact dosages):    Block needle gauge:  20 G   Paresthesia:  None Comments:     Wound irrigated with 3 L sterile normal saline, explored to depth in good light with full range of motion, no foreign body observed. Wound is dressed with Xeroform and bulky dressing.   (including critical care time)  Medications Ordered in ED Medications  ceFAZolin (ANCEF) IVPB 2g/100 mL premix (0 g Intravenous Stopped 06/03/17 1411)  bupivacaine (MARCAINE) 0.5 % injection 20 mL (20 mLs Infiltration Given 06/03/17 1339)  Tdap (BOOSTRIX) injection 0.5 mL (0.5 mLs Intramuscular Given 06/03/17 1339)     Initial Impression / Assessment and Plan / ED Course  I have reviewed the triage vital signs and the nursing notes.  Pertinent labs & imaging results that were available during my care of the patient were reviewed by me and considered in my medical decision making (see chart for details).     Vitals:   06/03/17 1137 06/03/17 1138 06/03/17 1653  BP: (!) 114/98  110/74  Pulse: (!) 110  70  Resp: 18  16  Temp: 99.4 F (37.4 C)    TempSrc: Oral    SpO2: 99%  100%  Weight:  63.5 kg (140 lb)   Height:  5\' 9"  (1.753 m)     Medications  ceFAZolin (ANCEF) IVPB  2g/100 mL premix (0 g Intravenous Stopped 06/03/17 1411)  bupivacaine (MARCAINE) 0.5 % injection 20 mL (20 mLs Infiltration Given 06/03/17 1339)  Tdap (BOOSTRIX) injection 0.5 mL (0.5 mLs Intramuscular Given 06/03/17 1339)    Mariana SingleChristopher Stirewalt is 26 y.o. male presenting with Traumatic avulsion to left ring finger with nailbed involvement. X-ray with fracture. 2 g of Ancef given IV, tetanus is updated. Wound is cleaned with 3 L normal saline, dressed and discussed with hand surgeon Dr. Janee Mornhompson who will come to the ED to evaluate and close the wound. Patient will follow with Dr. Janee Mornhompson as an outpatient. Meds per hand surgery.   Evaluation does not show pathology that would require ongoing  emergent intervention or inpatient treatment. Pt is hemodynamically stable and mentating appropriately. Discussed findings and plan with patient/guardian, who agrees with care plan. All questions answered. Return precautions discussed and outpatient follow up given.      Final Clinical Impressions(s) / ED Diagnoses   Final diagnoses:  Open displaced fracture of distal phalanx of left ring finger, initial encounter  Traumatic avulsion of nail plate of finger, initial encounter    New Prescriptions Discharge Medication List as of 06/03/2017  4:52 PM    START taking these medications   Details  acetaminophen (TYLENOL) 325 MG tablet Take 2 tablets (650 mg total) by mouth every 6 (six) hours as needed for mild pain or moderate pain., Starting Thu 06/03/2017, OTC    cephALEXin (KEFLEX) 500 MG capsule Take 1 capsule (500 mg total) by mouth 4 (four) times daily., Starting Thu 06/03/2017, Until Tue 06/08/2017, Print    ibuprofen (ADVIL) 200 MG tablet Take 3 tablets (600 mg total) by mouth every 6 (six) hours as needed for mild pain or moderate pain., Starting Thu 06/03/2017, OTC    oxyCODONE (ROXICODONE) 5 MG immediate release tablet Take 1 tablet (5 mg total) by mouth every 6 (six) hours as needed for severe  pain., Starting Thu 06/03/2017, Print         Jamonta Goerner, [place], PA-C 06/03/17 1755    Donnetta Hutching, MD 06/05/17 1136

## 2017-06-03 NOTE — Consult Note (Addendum)
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 male who was at work at [employer], where his left ring fingertip was injured by the router that he was using.  He presented with pain and a bleeding wound at the tip of the digit, which has been anesthetized, cleansed, and dressed.  He has received IV antibiotics.  Tetanus is up-to-date.  Past Medical History:  Diagnosis Date  . ACL tear   . Meniscus, lateral, derangement, left    History reviewed. No pertinent surgical history. Social History   Social History  . Marital status: Single    Spouse name: N/A  . Number of children: N/A  . Years of education: N/A   Social History Main Topics  . Smoking status: Former Games developer  . Smokeless tobacco: Never Used  . Alcohol use Yes     Comment: occ  . Drug use: No  . Sexual activity: Not Asked   Other Topics Concern  . None   Social History Narrative  . None   No family history on file. No Known Allergies Prior to Admission medications   Not on File   Dg Finger Ring Left  Result Date: 06/03/2017 CLINICAL DATA:  Left ring finger injury. EXAM: LEFT RING FINGER 2+V COMPARISON:  None. FINDINGS: Laceration of the distal aspect of the left fourth distal phalanx. Fracture of the distal tip of the left fourth distal phalanx. No other fracture or dislocation.  No other soft tissue abnormality. IMPRESSION: 1. Laceration of the distal aspect of the left fourth distal phalanx with an open fracture of the distal tip of the left fourth distal phalanx. Electronically Signed   By: Elige Ko   On: 06/03/2017 13:17    Positive ROS: All other systems have been reviewed and were otherwise negative with the exception of those mentioned in the HPI and as above.  Physical Exam: Vitals: Refer to EMR. Constitutional:  WD, WN, NAD HEENT:  NCAT, EOMI Neuro/Psych:  Alert & oriented  to person, place, and time; appropriate mood & affect Lymphatic: No generalized extremity edema or lymphadenopathy Extremities / MSK:  The extremities are normal with respect to appearance, ranges of motion, joint stability, muscle strength/tone, sensation, & perfusion except as otherwise noted:  There is a wound of the left ring fingertip, with an avulsion of tissue that has removed the ulnar nail fold in its midportion.  A portion of the nail bed appears to be missing.  The remaining nailbed still has nail plate adherent to it, although there are multiple splits within it.  The soft tissues have a large volar radial bridge.  Assessment: Left ring fingertip router injury  Plan: I discussed these findings with him.  It had already been cleansed, so I removed the nail plate.  After removing the nail plate, it appeared as if much of the nail bed remained, but there was ulnar-sided nail fold and medial pulp loss.  2 sutures of 4-0 Vicryl Rapide were used to close down this gap.  (simple closure, less than 2.5 cm) Digit was then dressed.  He will be discharged with antibiotics for 5 days, analgesics, and plan follow-up in a week.  My office will contact him to arrange appropriate authorization and follow-up in a week.  Cliffton Asters Janee Morn, MD      Orthopaedic & Hand Surgery [facility] Orthopaedic & Sports Medicine [facility] [address] [place], [place]  [id number] Office: [phone] Mobile: [phone]  06/03/2017, 3:24 PM

## 2020-02-12 ENCOUNTER — Other Ambulatory Visit: Payer: Self-pay

## 2020-02-12 ENCOUNTER — Ambulatory Visit (HOSPITAL_COMMUNITY)
Admission: EM | Admit: 2020-02-12 | Discharge: 2020-02-12 | Disposition: A | Discharge status: Home / Self Care | Payer: BC Managed Care – PPO | Attending: Family Medicine | Admitting: Family Medicine

## 2020-02-12 ENCOUNTER — Encounter (HOSPITAL_COMMUNITY): Payer: Self-pay

## 2020-02-12 DIAGNOSIS — F430 Acute stress reaction: Secondary | ICD-10-CM

## 2020-02-12 DIAGNOSIS — R21 Rash and other nonspecific skin eruption: Secondary | ICD-10-CM | POA: Diagnosis not present

## 2020-02-12 DIAGNOSIS — G44201 Tension-type headache, unspecified, intractable: Secondary | ICD-10-CM

## 2020-02-12 MED ORDER — CYCLOBENZAPRINE HCL 10 MG PO TABS: 10.0000 mg | ORAL_TABLET | Freq: Every day | ORAL | 0 refills | Status: DC

## 2020-02-12 MED ORDER — IBUPROFEN 800 MG PO TABS: 800.0000 mg | ORAL_TABLET | Freq: Three times a day (TID) | ORAL | 0 refills | Status: DC | PRN

## 2020-02-12 MED ORDER — TRIAMCINOLONE ACETONIDE 0.1 % EX CREA: 1.0000 "application " | TOPICAL_CREAM | Freq: Two times a day (BID) | CUTANEOUS | 0 refills | Status: DC

## 2020-02-12 NOTE — Discharge Instructions (Addendum)
Continue to eat well and exercise every day that you are able Take the ibuprofen as needed for headacye, take with food Take the flexeril ( cyclobenzaprine) at bedtime if needed, muscle relaxer Return as needed

## 2020-02-12 NOTE — ED Triage Notes (Signed)
Pt c/o posterior HA that he attributes to new onset HTN. Pt has been monitoring his BP at home with a max of 164/81. Pt visibly upset/tearful and states his head hurts "so bad I'm about to cut my dreads off". Also reports difficulty sleeping and increased stress.  Also some SOB yesterday and dizziness. Denies extremity weakness, slurred speech, changes in vision, n/v, CP  No OTC remedies taken for HA.

## 2020-02-12 NOTE — ED Provider Notes (Signed)
[facility]    CSN: [id number] Arrival date & time: 02/12/20  2706      History   Chief Complaint Chief Complaint  Patient presents with  . Headache  . Hypertension    HPI Chase Greer is a 29 y.o. male.   HPI  Patient is here for a headache.  He states he has a headache in the back of his head for the last week, off and on.  Today it has been "severe".  He states that he does not have any blurred vision, nausea, neurologic symptoms such as numbness weakness or balance problems, loss of coordination, loss of strength.  No change in mental status.  No headache condition previously such as migraine. He does say he is under a lot of stress.  He is laid off from his job and his significant other has been having seizures.  She is the mother of his 2 children.  Is not sleeping well.  He has been eating differently and has gained weight.  He thinks that this is caused him to have elevated blood pressure. He has been taking his blood pressure at home.  The highest numbers 164/81.  He thinks that the high blood pressure is causing his headaches.  He therefore worries that he is going to have a stroke.  He has never been diagnosed with hypertension or treated for hypertension in the past.  Hypertension does run in his family.  Past Medical History:  Diagnosis Date  . ACL tear   . Meniscus, lateral, derangement, left     Patient Active Problem List   Diagnosis Date Noted  . Left anterior cruciate ligament tear 07/02/2016  . Acute medial meniscal injury of knee, left, initial encounter 07/02/2016  . Complex tear of lateral meniscus of left knee as current injury 07/02/2016    History reviewed. No pertinent surgical history.     Home Medications    Prior to Admission medications   Medication Sig Start Date End Date Taking? Authorizing Provider  acetaminophen (TYLENOL) 325 MG tablet Take 2 tablets (650 mg total) by mouth every 6 (six) hours as needed for mild pain or  moderate pain. 06/03/17   Mack Hook, MD  cyclobenzaprine (FLEXERIL) 10 MG tablet Take 1 tablet (10 mg total) by mouth at bedtime. Muscle relaxer 02/12/20   Eustace Moore, MD  ibuprofen (ADVIL) 200 MG tablet Take 3 tablets (600 mg total) by mouth every 6 (six) hours as needed for mild pain or moderate pain. 06/03/17   Mack Hook, MD  ibuprofen (ADVIL) 800 MG tablet Take 1 tablet (800 mg total) by mouth every 8 (eight) hours as needed for headache. 02/12/20   Eustace Moore, MD  triamcinolone cream (KENALOG) 0.1 % Apply 1 application topically 2 (two) times daily. 02/12/20   Eustace Moore, MD    Family History Family History  Problem Relation Age of Onset  . Hypertension Mother   . Heart failure Mother   . Hypertension Father     Social History Social History   Tobacco Use  . Smoking status: Former Games developer  . Smokeless tobacco: Never Used  Substance Use Topics  . Alcohol use: Yes    Comment: occ  . Drug use: No     Allergies   Patient has no known allergies.   Review of Systems Review of Systems  Neurological: Positive for headaches.   See HPI  Physical Exam Triage Vital Signs ED Triage Vitals [02/12/20 1007]  Enc Vitals  Group     BP (!) 143/76     Pulse Rate 70     Resp 19     Temp 98.2 F (36.8 C)     Temp Source Oral     SpO2 100 %     Weight      Height      Head Circumference      Peak Flow      Pain Score 7     Pain Loc      Pain Edu?      Excl. in GC?    No data found.  Updated Vital Signs BP (!) 143/76 (BP Location: Left Arm)   Pulse 70   Temp 98.2 F (36.8 C) (Oral)   Resp 19   SpO2 100%      Physical Exam Constitutional:      General: He is not in acute distress.    Appearance: He is well-developed. He is not ill-appearing.     Comments: Mildly overweight.  HENT:     Head: Normocephalic and atraumatic.     Mouth/Throat:     Mouth: Mucous membranes are moist.     Pharynx: Oropharynx is clear.  Eyes:      General: No visual field deficit.    Extraocular Movements: Extraocular movements intact.     Right eye: No nystagmus.     Left eye: No nystagmus.     Conjunctiva/sclera: Conjunctivae normal.     Pupils: Pupils are equal, round, and reactive to light.  Cardiovascular:     Rate and Rhythm: Normal rate and regular rhythm.     Heart sounds: Normal heart sounds.  Pulmonary:     Effort: Pulmonary effort is normal. No respiratory distress.     Breath sounds: Normal breath sounds.  Abdominal:     General: Bowel sounds are normal. There is no distension.     Palpations: Abdomen is soft.  Musculoskeletal:        General: Tenderness present. Normal range of motion.     Cervical back: Normal range of motion. No rigidity.     Comments: Tenderness palpation of the cervical and upper trapezius muscles bilaterally  Lymphadenopathy:     Cervical: No cervical adenopathy.  Skin:    General: Skin is warm and dry.     Findings: Rash present.  Neurological:     Mental Status: He is alert. Mental status is at baseline.     Cranial Nerves: No cranial nerve deficit, dysarthria or facial asymmetry.     Coordination: Coordination normal.     Gait: Gait normal.     Deep Tendon Reflexes: Reflexes normal.  Psychiatric:        Behavior: Behavior normal.     Comments: Emotional lability      UC Treatments / Results  Labs (all labs ordered are listed, but only abnormal results are displayed) Labs Reviewed - No data to display  EKG   Radiology No results found.  Procedures Procedures (including critical care time)  Medications Ordered in UC Medications - No data to display  Initial Impression / Assessment and Plan / UC Course  I have reviewed the triage vital signs and the nursing notes.  Pertinent labs & imaging results that were available during my care of the patient were reviewed by me and considered in my medical decision making (see chart for details).     I reviewed with the  patient that I think his emotional stress is causing him to  have sleep deprivation.  We discussed that blood pressure is variable and can be elevated by both stress and sleep deprivation.  His blood pressure is not high enough to be a diagnosis of hypertension or to be treated.  With better lifestyle, I believe he will see improvement.  Final Clinical Impressions(s) / UC Diagnoses   Final diagnoses:  Acute intractable tension-type headache  Stress reaction  Rash and nonspecific skin eruption     Discharge Instructions     Continue to eat well and exercise every day that you are able Take the ibuprofen as needed for headacye, take with food Take the flexeril ( cyclobenzaprine) at bedtime if needed, muscle relaxer Return as needed   ED Prescriptions    Medication Sig Dispense Auth. Provider   triamcinolone cream (KENALOG) 0.1 % Apply 1 application topically 2 (two) times daily. 45 g Eustace Moore, MD   cyclobenzaprine (FLEXERIL) 10 MG tablet Take 1 tablet (10 mg total) by mouth at bedtime. Muscle relaxer 20 tablet Eustace Moore, MD   ibuprofen (ADVIL) 800 MG tablet Take 1 tablet (800 mg total) by mouth every 8 (eight) hours as needed for headache. 21 tablet Eustace Moore, MD     PDMP not reviewed this encounter.   Eustace Moore, MD 02/12/20 1100

## 2020-04-20 ENCOUNTER — Encounter (HOSPITAL_COMMUNITY): Payer: Self-pay | Admitting: Emergency Medicine

## 2020-04-20 ENCOUNTER — Ambulatory Visit (HOSPITAL_COMMUNITY)
Admission: EM | Admit: 2020-04-20 | Discharge: 2020-04-20 | Disposition: A | Discharge status: Home / Self Care | Payer: BC Managed Care – PPO | Attending: Family Medicine | Admitting: Family Medicine

## 2020-04-20 ENCOUNTER — Encounter (HOSPITAL_COMMUNITY): Payer: Self-pay

## 2020-04-20 ENCOUNTER — Emergency Department (HOSPITAL_COMMUNITY): Payer: BC Managed Care – PPO

## 2020-04-20 ENCOUNTER — Emergency Department (HOSPITAL_COMMUNITY)
Admission: EM | Admit: 2020-04-20 | Discharge: 2020-04-20 | Disposition: A | Discharge status: Home / Self Care | Payer: BC Managed Care – PPO | Attending: Emergency Medicine | Admitting: Emergency Medicine

## 2020-04-20 ENCOUNTER — Other Ambulatory Visit: Payer: Self-pay

## 2020-04-20 DIAGNOSIS — Z5321 Procedure and treatment not carried out due to patient leaving prior to being seen by health care provider: Secondary | ICD-10-CM | POA: Diagnosis not present

## 2020-04-20 DIAGNOSIS — S8992XA Unspecified injury of left lower leg, initial encounter: Secondary | ICD-10-CM

## 2020-04-20 DIAGNOSIS — M25562 Pain in left knee: Secondary | ICD-10-CM | POA: Insufficient documentation

## 2020-04-20 MED ORDER — IBUPROFEN 800 MG PO TABS: 800.0000 mg | ORAL_TABLET | Freq: Three times a day (TID) | ORAL | 0 refills | Status: DC | PRN

## 2020-04-20 MED ORDER — TRAMADOL HCL 50 MG PO TABS: 50.0000 mg | ORAL_TABLET | Freq: Two times a day (BID) | ORAL | 0 refills | Status: DC | PRN

## 2020-04-20 NOTE — ED Provider Notes (Signed)
[facility]    CSN: [id number] Arrival date & time: 04/20/20  1008      History   Chief Complaint Chief Complaint  Patient presents with  . Knee Pain    HPI Chase Greer is a 29 y.o. male.   Patient is a 29 year old male who presents today with left knee injury.  Reporting twisting injury during basketball game.  The knee was already feeling unstable prior to this starting.  Since he has had increased swelling, instability, pain with walking.  Was seen in the ER prior to coming here with knee x-ray that was without any acute findings.  The knee is swollen.     Past Medical History:  Diagnosis Date  . ACL tear   . Meniscus, lateral, derangement, left     Patient Active Problem List   Diagnosis Date Noted  . Left anterior cruciate ligament tear 07/02/2016  . Acute medial meniscal injury of knee, left, initial encounter 07/02/2016  . Complex tear of lateral meniscus of left knee as current injury 07/02/2016    History reviewed. No pertinent surgical history.     Home Medications    Prior to Admission medications   Medication Sig Start Date End Date Taking? Authorizing Provider  ibuprofen (ADVIL) 800 MG tablet Take 1 tablet (800 mg total) by mouth every 8 (eight) hours as needed for headache. 04/20/20   Dahlia Byes A, NP  traMADol (ULTRAM) 50 MG tablet Take 1 tablet (50 mg total) by mouth every 12 (twelve) hours as needed. 04/20/20   Janace Aris, NP    Family History Family History  Problem Relation Age of Onset  . Hypertension Mother   . Heart failure Mother   . Hypertension Father     Social History Social History   Tobacco Use  . Smoking status: Former Games developer  . Smokeless tobacco: Never Used  Substance Use Topics  . Alcohol use: Yes    Comment: occ  . Drug use: No     Allergies   Patient has no known allergies.   Review of Systems Review of Systems   Physical Exam Triage Vital Signs ED Triage Vitals  Enc Vitals Group      BP 04/20/20 1134 126/81     Pulse Rate 04/20/20 1134 72     Resp 04/20/20 1134 18     Temp 04/20/20 1134 98.4 F (36.9 C)     Temp Source 04/20/20 1134 Oral     SpO2 04/20/20 1134 100 %     Weight --      Height --      Head Circumference --      Peak Flow --      Pain Score 04/20/20 1131 10     Pain Loc --      Pain Edu? --      Excl. in GC? --    No data found.  Updated Vital Signs BP 126/81 (BP Location: Left Arm)   Pulse 72   Temp 98.4 F (36.9 C) (Oral)   Resp 18   SpO2 100%   Visual Acuity Right Eye Distance:   Left Eye Distance:   Bilateral Distance:    Right Eye Near:   Left Eye Near:    Bilateral Near:     Physical Exam Vitals and nursing note reviewed.  Constitutional:      Appearance: Normal appearance.  HENT:     Head: Normocephalic and atraumatic.     Nose: Nose normal.  Eyes:     Conjunctiva/sclera: Conjunctivae normal.  Pulmonary:     Effort: Pulmonary effort is normal.  Musculoskeletal:     Cervical back: Normal range of motion.     Left knee: Swelling and bony tenderness present. Decreased range of motion. Tenderness present over the medial joint line and lateral joint line.  Skin:    General: Skin is warm and dry.  Neurological:     Mental Status: He is alert.  Psychiatric:        Mood and Affect: Mood normal.      UC Treatments / Results  Labs (all labs ordered are listed, but only abnormal results are displayed) Labs Reviewed - No data to display  EKG   Radiology DG Knee Complete 4 Views Left  Result Date: 04/20/2020 CLINICAL DATA:  Acute left knee pain and swelling after injury yesterday. EXAM: LEFT KNEE - COMPLETE 4+ VIEW COMPARISON:  June 06, 2016. FINDINGS: No evidence of fracture, dislocation, or joint effusion. No evidence of arthropathy or other focal bone abnormality. Soft tissues are unremarkable. IMPRESSION: Negative. Electronically Signed   By: Lupita Raider M.D.   On: 04/20/2020 09:25     Procedures Procedures (including critical care time)  Medications Ordered in UC Medications - No data to display  Initial Impression / Assessment and Plan / UC Course  I have reviewed the triage vital signs and the nursing notes.  Pertinent labs & imaging results that were available during my care of the patient were reviewed by me and considered in my medical decision making (see chart for details).     Left knee injury.   Concern for ligamentous injury based on exam. Knee x-ray done without any acute findings. Will place in a knee stabilizer here and give crutches to be nonweightbearing until follow-up with orthopedics. Rest, ice, elevate and ibuprofen every 8 hours Tramadol for severe pain as needed. Final Clinical Impressions(s) / UC Diagnoses   Final diagnoses:  Knee injury, left, initial encounter     Discharge Instructions     Placing you in a knee stabilizer.  You need to stay off the knee as much as possible.  Rest, ice, elevate.  Ibuprofen 800 mg every 8 hours for pain, inflammation and swelling along with tramadol as needed for severe pain be aware of drowsiness with this medication.  Please follow-up with orthopedics on Monday    ED Prescriptions    Medication Sig Dispense Auth. Provider   ibuprofen (ADVIL) 800 MG tablet Take 1 tablet (800 mg total) by mouth every 8 (eight) hours as needed for headache. 21 tablet Tammi Boulier A, NP   traMADol (ULTRAM) 50 MG tablet Take 1 tablet (50 mg total) by mouth every 12 (twelve) hours as needed. 12 tablet Atara Paterson A, NP     I have reviewed the PDMP during this encounter.   Dahlia Byes A, NP 04/20/20 1156

## 2020-04-20 NOTE — ED Notes (Signed)
Pt was very angry & rude he did not receive percocets; pt stormed out without crutches

## 2020-04-20 NOTE — Discharge Instructions (Signed)
Placing you in a knee stabilizer.  You need to stay off the knee as much as possible.  Rest, ice, elevate.  Ibuprofen 800 mg every 8 hours for pain, inflammation and swelling along with tramadol as needed for severe pain be aware of drowsiness with this medication.  Please follow-up with orthopedics on Monday

## 2020-04-20 NOTE — ED Triage Notes (Signed)
Patient complains of left knee pain after playing basketball yesterday, hx of injury in past. Pain with ambulation

## 2020-04-20 NOTE — ED Notes (Signed)
Pt stated that he was going to go to urgent care because it would go a lot quicker instead of waiting for a room here in the ED.

## 2020-04-20 NOTE — ED Triage Notes (Signed)
Old left knee injury, re injured yesterday playing basketball.  Patient was in ED, had xrays, did not see a provider and is in ucc now

## 2020-05-07 ENCOUNTER — Other Ambulatory Visit: Payer: Self-pay | Admitting: Orthopedic Surgery

## 2020-05-07 DIAGNOSIS — M25562 Pain in left knee: Secondary | ICD-10-CM

## 2020-05-09 ENCOUNTER — Ambulatory Visit (HOSPITAL_COMMUNITY)
Admission: EM | Admit: 2020-05-09 | Discharge: 2020-05-09 | Disposition: A | Discharge status: Home / Self Care | Payer: BC Managed Care – PPO | Attending: Internal Medicine | Admitting: Internal Medicine

## 2020-05-09 ENCOUNTER — Encounter (HOSPITAL_COMMUNITY): Payer: Self-pay | Admitting: Emergency Medicine

## 2020-05-09 ENCOUNTER — Other Ambulatory Visit: Payer: Self-pay

## 2020-05-09 DIAGNOSIS — U071 COVID-19: Secondary | ICD-10-CM

## 2020-05-09 DIAGNOSIS — Z20822 Contact with and (suspected) exposure to covid-19: Secondary | ICD-10-CM

## 2020-05-09 HISTORY — DX: COVID-19: U07.1

## 2020-05-09 LAB — SARS CORONAVIRUS 2 (TAT 6-24 HRS): SARS Coronavirus 2: POSITIVE — AB

## 2020-05-09 NOTE — ED Triage Notes (Signed)
Patient requesting covid test. Denies symptoms.  Denies seeing provider

## 2020-05-09 NOTE — Discharge Instructions (Signed)

## 2020-05-10 ENCOUNTER — Telehealth (HOSPITAL_COMMUNITY): Payer: Self-pay

## 2020-05-10 NOTE — Telephone Encounter (Signed)
RN reached out regarding monoclonal antibody infusion for treatment of COVID. Pt stated he is feeling better, only experiencing loss of taste/smell. Pt educated about home isolation for 10 days, pt understands without assistance, pt will call us if he changes his mind.

## 2020-05-17 ENCOUNTER — Ambulatory Visit (HOSPITAL_COMMUNITY)
Admission: RE | Admit: 2020-05-17 | Discharge: 2020-05-17 | Disposition: A | Discharge status: Home / Self Care | Payer: BC Managed Care – PPO | Source: Ambulatory Visit | Attending: Orthopedic Surgery | Admitting: Orthopedic Surgery

## 2020-05-17 ENCOUNTER — Other Ambulatory Visit: Payer: Self-pay

## 2020-05-17 DIAGNOSIS — M25562 Pain in left knee: Secondary | ICD-10-CM | POA: Diagnosis not present

## 2020-08-14 NOTE — Progress Notes (Signed)
Per pt stated he needed to reschedule his surgery on 08-15-2020 due to his insurance does not start until 08-17-2020.  Pt stated he called dr Aundria Rud office left message with Morrie Sheldon but had not received call back from the office to reschedule yet.  Called and left message for kelly , or scheduler at dr rogers office, since sherri or scheduler for dr Aundria Rud is off today.

## 2020-08-15 ENCOUNTER — Ambulatory Visit (HOSPITAL_BASED_OUTPATIENT_CLINIC_OR_DEPARTMENT_OTHER)
Admission: RE | Admit: 2020-08-15 | Payer: BC Managed Care – PPO | Source: Home / Self Care | Admitting: Orthopedic Surgery

## 2020-08-15 ENCOUNTER — Encounter (HOSPITAL_BASED_OUTPATIENT_CLINIC_OR_DEPARTMENT_OTHER): Admission: RE | Payer: Self-pay | Source: Home / Self Care

## 2020-08-15 SURGERY — RECONSTRUCTION, KNEE, ACL, USING HAMSTRING GRAFT
Anesthesia: Choice | Laterality: Left

## 2021-01-29 ENCOUNTER — Telehealth (HOSPITAL_COMMUNITY): Payer: Self-pay | Admitting: Medical Oncology

## 2021-01-29 ENCOUNTER — Encounter (HOSPITAL_COMMUNITY): Payer: Self-pay | Admitting: Emergency Medicine

## 2021-01-29 ENCOUNTER — Ambulatory Visit (HOSPITAL_COMMUNITY)
Admission: EM | Admit: 2021-01-29 | Discharge: 2021-01-29 | Disposition: A | Discharge status: Home / Self Care | Payer: Self-pay | Attending: Medical Oncology | Admitting: Medical Oncology

## 2021-01-29 ENCOUNTER — Other Ambulatory Visit: Payer: Self-pay

## 2021-01-29 ENCOUNTER — Telehealth (HOSPITAL_COMMUNITY): Payer: Self-pay

## 2021-01-29 DIAGNOSIS — H60502 Unspecified acute noninfective otitis externa, left ear: Secondary | ICD-10-CM

## 2021-01-29 DIAGNOSIS — H66002 Acute suppurative otitis media without spontaneous rupture of ear drum, left ear: Secondary | ICD-10-CM

## 2021-01-29 MED ORDER — CIPROFLOXACIN-DEXAMETHASONE 0.3-0.1 % OT SUSP: 4.0000 [drp] | Freq: Two times a day (BID) | OTIC | 0 refills | Status: DC

## 2021-01-29 MED ORDER — OFLOXACIN 0.3 % OT SOLN: 5.0000 [drp] | Freq: Every day | OTIC | 0 refills | Status: DC

## 2021-01-29 NOTE — ED Provider Notes (Signed)
[facility]    CSN: [id number] Arrival date & time: 01/29/21  1512      History   Chief Complaint Chief Complaint  Patient presents with   Otalgia    Left     HPI Chase Greer is a 30 y.o. male.   HPI   Ear Pain: Patient reports that he has had left ear pain as of today. He denies trauma, significant loss of hearing, discharge. He has tried peroxide for symptoms with little relief. NO fevers, cold symptoms.   Past Medical History:  Diagnosis Date   ACL tear    Meniscus, lateral, derangement, left     Patient Active Problem List   Diagnosis Date Noted   Left anterior cruciate ligament tear 07/02/2016   Acute medial meniscal injury of knee, left, initial encounter 07/02/2016   Complex tear of lateral meniscus of left knee as current injury 07/02/2016    History reviewed. No pertinent surgical history.     Home Medications    Prior to Admission medications   Medication Sig Start Date End Date Taking? Authorizing Provider  ibuprofen (ADVIL) 800 MG tablet Take 1 tablet (800 mg total) by mouth every 8 (eight) hours as needed for headache. 04/20/20   Dahlia Byes A, NP  traMADol (ULTRAM) 50 MG tablet Take 1 tablet (50 mg total) by mouth every 12 (twelve) hours as needed. 04/20/20   Janace Aris, NP    Family History Family History  Problem Relation Age of Onset   Hypertension Mother    Heart failure Mother    Hypertension Father     Social History Social History   Tobacco Use   Smoking status: Former    Pack years: 0.00   Smokeless tobacco: Never  Substance Use Topics   Alcohol use: Yes    Comment: occ   Drug use: No     Allergies   Patient has no known allergies.   Review of Systems Review of Systems  As stated above in HPI Physical Exam Triage Vital Signs ED Triage Vitals  Enc Vitals Group     BP 01/29/21 1618 127/82     Pulse Rate 01/29/21 1618 (!) 54     Resp 01/29/21 1618 18     Temp 01/29/21 1618 (!) 97.5 F (36.4  C)     Temp src --      SpO2 01/29/21 1618 98 %     Weight --      Height --      Head Circumference --      Peak Flow --      Pain Score 01/29/21 1616 8     Pain Loc --      Pain Edu? --      Excl. in GC? --    No data found.  Updated Vital Signs BP 127/82   Pulse (!) 54   Temp (!) 97.5 F (36.4 C)   Resp 18   SpO2 98%   Physical Exam Vitals and nursing note reviewed.  Constitutional:      General: He is not in acute distress.    Appearance: Normal appearance. He is not ill-appearing, toxic-appearing or diaphoretic.  HENT:     Head: Normocephalic and atraumatic.     Right Ear: Hearing, tympanic membrane, ear canal and external ear normal.     Left Ear: Hearing and external ear normal. Drainage and swelling present. Tympanic membrane is injected, erythematous and bulging.  Cardiovascular:  Rate and Rhythm: Normal rate and regular rhythm.     Heart sounds: Normal heart sounds.  Pulmonary:     Effort: Pulmonary effort is normal.     Breath sounds: Normal breath sounds.  Neurological:     Mental Status: He is alert.     UC Treatments / Results  Labs (all labs ordered are listed, but only abnormal results are displayed) Labs Reviewed - No data to display  EKG   Radiology No results found.  Procedures Procedures (including critical care time)  Medications Ordered in UC Medications - No data to display  Initial Impression / Assessment and Plan / UC Course  I have reviewed the triage vital signs and the nursing notes.  Pertinent labs & imaging results that were available during my care of the patient were reviewed by me and considered in my medical decision making (see chart for details).     New.  Discussed with patient that he appears to have otitis media and externa.  We discussed that otitis media in adults without fever and unilateral presentation typically is viral in nature we do not recommend antibiotics however we are going to treat with  Ciprodex which should help with both of his external and internal symptoms.  NSAIDs may help as well.  Discussed red flag signs and symptoms. Final Clinical Impressions(s) / UC Diagnoses   Final diagnoses:  None   Discharge Instructions   None    ED Prescriptions   None    PDMP not reviewed this encounter.   Rushie Chestnut, [place] 01/29/21 1641

## 2021-01-29 NOTE — Telephone Encounter (Signed)
Med switch due to cost

## 2021-01-29 NOTE — Telephone Encounter (Signed)
RN notified that pt had called and stated medication he was prescribed was too expensive. Clent Jacks PA notified and placed an order for a different prescription. Pt called and notified, pt verbalized understanding of need to pick up medication from pharmacy tonight.

## 2021-01-29 NOTE — ED Triage Notes (Signed)
Pt is present today with left ear pain. Pt states that his pain started today.

## 2021-04-13 ENCOUNTER — Ambulatory Visit (HOSPITAL_COMMUNITY)
Admission: EM | Admit: 2021-04-13 | Discharge: 2021-04-13 | Disposition: A | Discharge status: Home / Self Care | Payer: Self-pay | Attending: Student | Admitting: Student

## 2021-04-13 ENCOUNTER — Other Ambulatory Visit: Payer: Self-pay

## 2021-04-13 ENCOUNTER — Encounter (HOSPITAL_COMMUNITY): Payer: Self-pay | Admitting: *Deleted

## 2021-04-13 DIAGNOSIS — M7662 Achilles tendinitis, left leg: Secondary | ICD-10-CM

## 2021-04-13 MED ORDER — KETOROLAC TROMETHAMINE 30 MG/ML IJ SOLN
30.0000 mg | Freq: Once | INTRAMUSCULAR | Status: AC
  Administered 2021-04-13: 30 mg via INTRAMUSCULAR

## 2021-04-13 MED ORDER — KETOROLAC TROMETHAMINE 30 MG/ML IJ SOLN
INTRAMUSCULAR | Status: AC
  Filled 2021-04-13: qty 1

## 2021-04-13 MED ORDER — KETOROLAC TROMETHAMINE 10 MG PO TABS: 10.0000 mg | ORAL_TABLET | Freq: Four times a day (QID) | ORAL | 0 refills | Status: DC | PRN

## 2021-04-13 NOTE — Discharge Instructions (Addendum)
-  Toradol (Ketorolac), one pill up to every 6 hours for pain. Make sure to take this with food. Avoid other NSAIDs like ibuprofen, alleve, naproxen while taking this medication.  -You can also take Tylenol 1000mg  up to 3x daily -Use the ankle brace while pain persists, especially while standing and walking -Rest, ice, elevation can help with the swelling

## 2021-04-13 NOTE — ED Notes (Signed)
Achilles heel pain since Monday

## 2021-04-13 NOTE — ED Triage Notes (Signed)
Pt reports swelling to posterior hell that started on Monday.

## 2021-04-13 NOTE — ED Provider Notes (Signed)
[facility]    CSN: [id number] Arrival date & time: 04/13/21  1017      History   Chief Complaint Chief Complaint  Patient presents with   Foot Pain    posterior    HPI Chase Greer is a 30 y.o. male presenting with left heel pain for about 2 months.  Medical history noncontributory.  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  Notes 2 months of intermittent pain of the posterior heel, worse with movement and standing.  Has not tried any medications for the symptoms.  Denies sensation changes.  Denies recent falls.  HPI  Past Medical History:  Diagnosis Date   ACL tear    Meniscus, lateral, derangement, left     Patient Active Problem List   Diagnosis Date Noted   Left anterior cruciate ligament tear 07/02/2016   Acute medial meniscal injury of knee, left, initial encounter 07/02/2016   Complex tear of lateral meniscus of left knee as current injury 07/02/2016    History reviewed. No pertinent surgical history.     Home Medications    Prior to Admission medications   Medication Sig Start Date End Date Taking? Authorizing Provider  ketorolac (TORADOL) 10 MG tablet Take 1 tablet (10 mg total) by mouth every 6 (six) hours as needed. 04/13/21  Yes Rhys Martini, PA-C  ofloxacin (FLOXIN) 0.3 % OTIC solution Place 5 drops into the left ear daily. 01/29/21   Rushie Chestnut, PA-C    Family History Family History  Problem Relation Age of Onset   Hypertension Mother    Heart failure Mother    Hypertension Father     Social History Social History   Tobacco Use   Smoking status: Former   Smokeless tobacco: Never  Substance Use Topics   Alcohol use: Yes    Comment: occ   Drug use: No     Allergies   Patient has no known allergies.   Review of Systems Review of Systems  Musculoskeletal:        L heel pain  All other systems reviewed and are negative.   Physical Exam Triage Vital  Signs ED Triage Vitals  Enc Vitals Group     BP 04/13/21 1131 99/77     Pulse Rate 04/13/21 1131 62     Resp 04/13/21 1131 18     Temp 04/13/21 1131 98.1 F (36.7 C)     Temp src --      SpO2 04/13/21 1131 100 %     Weight --      Height --      Head Circumference --      Peak Flow --      Pain Score 04/13/21 1134 10     Pain Loc --      Pain Edu? --      Excl. in GC? --    No data found.  Updated Vital Signs BP 99/77   Pulse 62   Temp 98.1 F (36.7 C)   Resp 18   SpO2 100%   Visual Acuity Right Eye Distance:   Left Eye Distance:   Bilateral Distance:    Right Eye Near:   Left Eye Near:    Bilateral Near:     Physical Exam Vitals reviewed.  Constitutional:      Appearance: Normal appearance.  HENT:     Head: Normocephalic and atraumatic.  Pulmonary:  Effort: Pulmonary effort is normal.  Musculoskeletal:     Comments: TTP L distal achilles tendon. Mild effusion laterally. Pain elicited with dorsiflexion, but minimal pain with plantarflexion. DP 2+, cap refill <2 seconds. No malleolar or midfoot tenderness.  Skin:    Capillary Refill: Capillary refill takes less than 2 seconds.  Neurological:     General: No focal deficit present.     Mental Status: He is alert and oriented to person, place, and time.  Psychiatric:        Mood and Affect: Mood normal.        Behavior: Behavior normal.        Thought Content: Thought content normal.        Judgment: Judgment normal.     UC Treatments / Results  Labs (all labs ordered are listed, but only abnormal results are displayed) Labs Reviewed - No data to display  EKG   Radiology No results found.  Procedures Procedures (including critical care time)  Medications Ordered in UC Medications  ketorolac (TORADOL) 30 MG/ML injection 30 mg (30 mg Intramuscular Given 04/13/21 1219)    Initial Impression / Assessment and Plan / UC Course  I have reviewed the triage vital signs and the nursing  notes.  Pertinent labs & imaging results that were available during my care of the patient were reviewed by me and considered in my medical decision making (see chart for details).     This patient is a very pleasant 30 y.o. year old male presenting with L achilles tendonitis related to overuse. Neurovascularly intact.   ASO brace, toradol PO for pain. Toradol IM today at patient request. RICE, work note provided stating light duty.  ED return precautions discussed. Patient verbalizes understanding and agreement.    Final Clinical Impressions(s) / UC Diagnoses   Final diagnoses:  Achilles tendinitis of left lower extremity     Discharge Instructions      -Toradol (Ketorolac), one pill up to every 6 hours for pain. Make sure to take this with food. Avoid other NSAIDs like ibuprofen, alleve, naproxen while taking this medication.  -You can also take Tylenol 1000mg  up to 3x daily -Use the ankle brace while pain persists, especially while standing and walking -Rest, ice, elevation can help with the swelling   ED Prescriptions     Medication Sig Dispense Auth. Provider   ketorolac (TORADOL) 10 MG tablet Take 1 tablet (10 mg total) by mouth every 6 (six) hours as needed. 20 tablet , PA-C      PDMP not reviewed this encounter.   Rhys Martini, PA-C 04/13/21 1227

## 2021-07-05 DIAGNOSIS — M79671 Pain in right foot: Secondary | ICD-10-CM | POA: Diagnosis not present

## 2021-07-05 DIAGNOSIS — S92311A Displaced fracture of first metatarsal bone, right foot, initial encounter for closed fracture: Secondary | ICD-10-CM | POA: Diagnosis not present

## 2021-07-05 DIAGNOSIS — S01511A Laceration without foreign body of lip, initial encounter: Secondary | ICD-10-CM | POA: Insufficient documentation

## 2021-07-05 DIAGNOSIS — Z87891 Personal history of nicotine dependence: Secondary | ICD-10-CM | POA: Diagnosis not present

## 2021-07-05 DIAGNOSIS — S92321A Displaced fracture of second metatarsal bone, right foot, initial encounter for closed fracture: Secondary | ICD-10-CM | POA: Diagnosis not present

## 2021-07-05 DIAGNOSIS — Y92410 Unspecified street and highway as the place of occurrence of the external cause: Secondary | ICD-10-CM | POA: Diagnosis not present

## 2021-07-05 DIAGNOSIS — S0990XA Unspecified injury of head, initial encounter: Secondary | ICD-10-CM | POA: Diagnosis not present

## 2021-07-05 DIAGNOSIS — R0789 Other chest pain: Secondary | ICD-10-CM | POA: Diagnosis not present

## 2021-07-05 DIAGNOSIS — S99921A Unspecified injury of right foot, initial encounter: Secondary | ICD-10-CM | POA: Diagnosis present
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
## 2021-07-06 ENCOUNTER — Emergency Department (HOSPITAL_COMMUNITY): Payer: No Typology Code available for payment source

## 2021-07-06 ENCOUNTER — Emergency Department (HOSPITAL_COMMUNITY)
Admission: EM | Admit: 2021-07-06 | Discharge: 2021-07-06 | Disposition: A | Discharge status: Home / Self Care | Payer: No Typology Code available for payment source | Attending: Emergency Medicine | Admitting: Emergency Medicine

## 2021-07-06 ENCOUNTER — Other Ambulatory Visit: Payer: Self-pay

## 2021-07-06 ENCOUNTER — Encounter (HOSPITAL_COMMUNITY): Payer: Self-pay | Admitting: Emergency Medicine

## 2021-07-06 DIAGNOSIS — S0990XA Unspecified injury of head, initial encounter: Secondary | ICD-10-CM

## 2021-07-06 DIAGNOSIS — S92901A Unspecified fracture of right foot, initial encounter for closed fracture: Secondary | ICD-10-CM

## 2021-07-06 MED ORDER — HYDROCODONE-ACETAMINOPHEN 5-325 MG PO TABS
1.0000 | ORAL_TABLET | Freq: Four times a day (QID) | ORAL | 0 refills | Status: DC | PRN
Start: 2021-07-06 — End: 2021-07-09

## 2021-07-06 MED ORDER — HYDROCODONE-ACETAMINOPHEN 5-325 MG PO TABS
2.0000 | ORAL_TABLET | Freq: Once | ORAL | Status: AC
  Administered 2021-07-06: 2 via ORAL
  Filled 2021-07-06: qty 2

## 2021-07-06 MED ORDER — OXYCODONE-ACETAMINOPHEN 5-325 MG PO TABS
1.0000 | ORAL_TABLET | Freq: Once | ORAL | Status: AC
  Administered 2021-07-06: 1 via ORAL
  Filled 2021-07-06: qty 1

## 2021-07-06 NOTE — Discharge Instructions (Addendum)
No weightbearing on the right foot use crutches at all times.  Keep the splint in place and dry.  Elevate the right foot as much as possible.  Orthopedic thinks that this is going to require surgery to fix the foot.  Take the pain medicine as needed.  Call Dr. Audrie Lia office for follow-up on Monday.

## 2021-07-06 NOTE — Progress Notes (Signed)
Orthopedic Tech Progress Note Patient Details:  Chase Greer 1991-05-05 [id number]  Ortho Devices Type of Ortho Device: Stirrup splint, Post (short leg) splint Ortho Device/Splint Location: rle Ortho Device/Splint Interventions: Ordered, Application, Adjustment   Post Interventions Patient Tolerated: Well  Al Decant 07/06/2021, 3:38 PM
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
## 2021-07-06 NOTE — ED Notes (Signed)
Ortho tech at bedside 

## 2021-07-06 NOTE — ED Notes (Signed)
Patient transported to CT 
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
## 2021-07-07 ENCOUNTER — Ambulatory Visit (INDEPENDENT_AMBULATORY_CARE_PROVIDER_SITE_OTHER): Payer: Worker's Compensation | Admitting: Orthopedic Surgery

## 2021-07-07 ENCOUNTER — Encounter: Payer: Self-pay | Admitting: Orthopedic Surgery

## 2021-07-07 DIAGNOSIS — S93324A Dislocation of tarsometatarsal joint of right foot, initial encounter: Secondary | ICD-10-CM
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
## 2021-07-08 ENCOUNTER — Encounter (HOSPITAL_COMMUNITY): Payer: Self-pay | Admitting: Orthopedic Surgery

## 2021-07-08 ENCOUNTER — Other Ambulatory Visit: Payer: Self-pay

## 2021-07-08 NOTE — Progress Notes (Signed)
Spoke with pt for pre-op call. He denies any cardiac history.  Pt's surgery is scheduled as ambulatory so no Covid test is required prior to surgery.

## 2021-07-09 ENCOUNTER — Other Ambulatory Visit: Payer: Self-pay

## 2021-07-09 ENCOUNTER — Ambulatory Visit (HOSPITAL_COMMUNITY)
Admission: RE | Admit: 2021-07-09 | Discharge: 2021-07-09 | Disposition: A | Discharge status: Home / Self Care | Payer: No Typology Code available for payment source | Attending: Orthopedic Surgery | Admitting: Orthopedic Surgery

## 2021-07-09 ENCOUNTER — Ambulatory Visit (HOSPITAL_COMMUNITY): Payer: No Typology Code available for payment source

## 2021-07-09 ENCOUNTER — Encounter (HOSPITAL_COMMUNITY): Payer: Self-pay | Admitting: Orthopedic Surgery

## 2021-07-09 ENCOUNTER — Encounter (HOSPITAL_COMMUNITY)
Admission: RE | Disposition: A | Discharge status: Home / Self Care | Payer: Self-pay | Source: Home / Self Care | Attending: Orthopedic Surgery

## 2021-07-09 ENCOUNTER — Ambulatory Visit (HOSPITAL_COMMUNITY): Payer: No Typology Code available for payment source | Admitting: Anesthesiology

## 2021-07-09 DIAGNOSIS — Z87891 Personal history of nicotine dependence: Secondary | ICD-10-CM | POA: Insufficient documentation

## 2021-07-09 DIAGNOSIS — S93324A Dislocation of tarsometatarsal joint of right foot, initial encounter: Secondary | ICD-10-CM | POA: Diagnosis present

## 2021-07-09 HISTORY — PX: FOOT ARTHRODESIS: SHX1655

## 2021-07-09 LAB — SURGICAL PCR SCREEN
MRSA, PCR: NEGATIVE
Staphylococcus aureus: NEGATIVE

## 2021-07-09 SURGERY — FUSION, JOINT, FOOT
Anesthesia: Monitor Anesthesia Care | Site: Foot | Laterality: Right

## 2021-07-09 MED ORDER — FENTANYL CITRATE (PF) 250 MCG/5ML IJ SOLN
INTRAMUSCULAR | Status: AC
  Filled 2021-07-09: qty 5

## 2021-07-09 MED ORDER — LACTATED RINGERS IV SOLN: INTRAVENOUS | Status: DC

## 2021-07-09 MED ORDER — MIDAZOLAM HCL 2 MG/2ML IJ SOLN
INTRAMUSCULAR | Status: AC
  Filled 2021-07-09: qty 2

## 2021-07-09 MED ORDER — 0.9 % SODIUM CHLORIDE (POUR BTL) OPTIME
TOPICAL | Status: DC | PRN
  Administered 2021-07-09: 1000 mL

## 2021-07-09 MED ORDER — MIDAZOLAM HCL 2 MG/2ML IJ SOLN: 2.0000 mg | Freq: Once | INTRAMUSCULAR | Status: AC

## 2021-07-09 MED ORDER — CHLORHEXIDINE GLUCONATE 0.12 % MT SOLN: 15.0000 mL | Freq: Once | OROMUCOSAL | Status: AC

## 2021-07-09 MED ORDER — FENTANYL CITRATE (PF) 100 MCG/2ML IJ SOLN: 100.0000 ug | Freq: Once | INTRAMUSCULAR | Status: AC

## 2021-07-09 MED ORDER — POVIDONE-IODINE 10 % EX SWAB
2.0000 "application " | Freq: Once | CUTANEOUS | Status: AC
  Administered 2021-07-09: 2 via TOPICAL

## 2021-07-09 MED ORDER — CEFAZOLIN SODIUM-DEXTROSE 2-4 GM/100ML-% IV SOLN
2.0000 g | INTRAVENOUS | Status: AC
  Administered 2021-07-09: 2 g via INTRAVENOUS
  Filled 2021-07-09: qty 100

## 2021-07-09 MED ORDER — PROPOFOL 500 MG/50ML IV EMUL
INTRAVENOUS | Status: DC | PRN
  Administered 2021-07-09: 200 ug/kg/min via INTRAVENOUS
  Administered 2021-07-09: 125 ug/kg/min via INTRAVENOUS

## 2021-07-09 MED ORDER — ORAL CARE MOUTH RINSE: 15.0000 mL | Freq: Once | OROMUCOSAL | Status: AC

## 2021-07-09 MED ORDER — CHLORHEXIDINE GLUCONATE 4 % EX LIQD: 60.0000 mL | Freq: Once | CUTANEOUS | Status: DC

## 2021-07-09 MED ORDER — LIDOCAINE 2% (20 MG/ML) 5 ML SYRINGE
INTRAMUSCULAR | Status: AC
  Filled 2021-07-09: qty 5

## 2021-07-09 MED ORDER — HYDROCODONE-ACETAMINOPHEN 5-325 MG PO TABS: 1.0000 | ORAL_TABLET | ORAL | 0 refills | Status: DC | PRN

## 2021-07-09 MED ORDER — PROPOFOL 10 MG/ML IV BOLUS
INTRAVENOUS | Status: AC
  Filled 2021-07-09: qty 20

## 2021-07-09 MED ORDER — HYDROMORPHONE HCL 1 MG/ML IJ SOLN: 0.2500 mg | INTRAMUSCULAR | Status: DC | PRN

## 2021-07-09 MED ORDER — FENTANYL CITRATE (PF) 100 MCG/2ML IJ SOLN
INTRAMUSCULAR | Status: AC
  Administered 2021-07-09: 100 ug via INTRAVENOUS
  Filled 2021-07-09: qty 2

## 2021-07-09 MED ORDER — CHLORHEXIDINE GLUCONATE 0.12 % MT SOLN
OROMUCOSAL | Status: AC
  Administered 2021-07-09: 15 mL via OROMUCOSAL
  Filled 2021-07-09: qty 15

## 2021-07-09 MED ORDER — MUPIROCIN 2 % EX OINT
1.0000 "application " | TOPICAL_OINTMENT | Freq: Two times a day (BID) | CUTANEOUS | Status: DC
  Filled 2021-07-09: qty 22

## 2021-07-09 MED ORDER — PROPOFOL 10 MG/ML IV BOLUS
INTRAVENOUS | Status: DC | PRN
  Administered 2021-07-09 (×2): 100 mg via INTRAVENOUS

## 2021-07-09 MED ORDER — BUPIVACAINE-EPINEPHRINE (PF) 0.5% -1:200000 IJ SOLN
INTRAMUSCULAR | Status: DC | PRN
  Administered 2021-07-09: 30 mL via PERINEURAL
  Administered 2021-07-09: 10 mL via PERINEURAL

## 2021-07-09 MED ORDER — MIDAZOLAM HCL 2 MG/2ML IJ SOLN
INTRAMUSCULAR | Status: AC
  Administered 2021-07-09: 2 mg via INTRAVENOUS
  Filled 2021-07-09: qty 2

## 2021-07-09 MED ORDER — LIDOCAINE 20MG/ML (2%) 15 ML SYRINGE OPTIME
INTRAMUSCULAR | Status: DC | PRN
  Administered 2021-07-09: 25 mg via INTRAVENOUS

## 2021-07-09 MED ORDER — ACETAMINOPHEN 500 MG PO TABS
1000.0000 mg | ORAL_TABLET | Freq: Once | ORAL | Status: AC
  Administered 2021-07-09: 1000 mg via ORAL
  Filled 2021-07-09: qty 2

## 2021-07-09 SURGICAL SUPPLY — 59 items
BAG COUNTER SPONGE SURGICOUNT (BAG) ×2 IMPLANT
BAG SPNG CNTER NS LX DISP (BAG) ×1
BIT DRILL 2.5X2.75 QC CALB (BIT) ×1 IMPLANT
BIT DRILL 3.0 6IN (DRILL) IMPLANT
BLADE LONG MED 31X9 (MISCELLANEOUS) ×1 IMPLANT
BLADE SAW SGTL HD 18.5X60.5X1. (BLADE) ×2 IMPLANT
BLADE SURG 10 STRL SS (BLADE) IMPLANT
BNDG COHESIVE 4X5 TAN STRL (GAUZE/BANDAGES/DRESSINGS) ×2 IMPLANT
BNDG GAUZE ELAST 4 BULKY (GAUZE/BANDAGES/DRESSINGS) ×4 IMPLANT
COTTON STERILE ROLL (GAUZE/BANDAGES/DRESSINGS) ×2 IMPLANT
COVER MAYO STAND STRL (DRAPES) IMPLANT
COVER SURGICAL LIGHT HANDLE (MISCELLANEOUS) ×4 IMPLANT
DRAPE DERMATAC (DRAPES) ×2 IMPLANT
DRAPE INCISE IOBAN 66X45 STRL (DRAPES) ×2 IMPLANT
DRAPE OEC MINIVIEW 54X84 (DRAPES) IMPLANT
DRAPE U-SHAPE 47X51 STRL (DRAPES) ×2 IMPLANT
DRESSING PEEL AND PLC PRVNA 13 (GAUZE/BANDAGES/DRESSINGS) IMPLANT
DRILL 3.0 6IN (DRILL) ×2
DRSG ADAPTIC 3X8 NADH LF (GAUZE/BANDAGES/DRESSINGS) ×2 IMPLANT
DRSG PEEL AND PLACE PREVENA 13 (GAUZE/BANDAGES/DRESSINGS) ×2
DURAPREP 26ML APPLICATOR (WOUND CARE) ×2 IMPLANT
ELECT REM PT RETURN 9FT ADLT (ELECTROSURGICAL) ×2
ELECTRODE REM PT RTRN 9FT ADLT (ELECTROSURGICAL) ×1 IMPLANT
GAUZE SPONGE 4X4 12PLY STRL (GAUZE/BANDAGES/DRESSINGS) ×2 IMPLANT
GLOVE SURG ORTHO LTX SZ9 (GLOVE) ×2 IMPLANT
GLOVE SURG UNDER POLY LF SZ9 (GLOVE) ×2 IMPLANT
GOWN STRL REUS W/ TWL XL LVL3 (GOWN DISPOSABLE) ×3 IMPLANT
GOWN STRL REUS W/TWL XL LVL3 (GOWN DISPOSABLE) ×6
GUIDEWIRE 1.6 6IN (WIRE) ×1 IMPLANT
K-WIRE ACE 1.6X6 (WIRE) ×4
KIT BASIN OR (CUSTOM PROCEDURE TRAY) ×2 IMPLANT
KIT TURNOVER KIT B (KITS) ×2 IMPLANT
KWIRE ACE 1.6X6 (WIRE) IMPLANT
MANIFOLD NEPTUNE II (INSTRUMENTS) ×2 IMPLANT
NS IRRIG 1000ML POUR BTL (IV SOLUTION) ×2 IMPLANT
PACK ORTHO EXTREMITY (CUSTOM PROCEDURE TRAY) ×2 IMPLANT
PAD ARMBOARD 7.5X6 YLW CONV (MISCELLANEOUS) ×4 IMPLANT
PAD CAST 4YDX4 CTTN HI CHSV (CAST SUPPLIES) ×1 IMPLANT
PADDING CAST COTTON 4X4 STRL (CAST SUPPLIES) ×2
PLATE LOCK LG LAT MIDFOOT (Plate) ×1 IMPLANT
SCREW CANN SHT HDLS 4X44 (Screw) ×1 IMPLANT
SCREW CORT 3.5X26 (Screw) ×2 IMPLANT
SCREW CORT 3.5X32 (Screw) ×2 IMPLANT
SCREW CORT T15 24X3.5XST LCK (Screw) IMPLANT
SCREW CORT T15 26X3.5XST LCK (Screw) IMPLANT
SCREW CORT T15 30X3.5XST LCK (Screw) IMPLANT
SCREW CORT T15 32X3.5XST LCK (Screw) IMPLANT
SCREW CORTICAL 3.5X24MM (Screw) ×2 IMPLANT
SCREW CORTICAL 3.5X30MM (Screw) ×2 IMPLANT
SCREW CORTICAL LOW PROF 3.5X20 (Screw) ×1 IMPLANT
SCREW NON LOCKING LP 3.5 14MM (Screw) ×1 IMPLANT
SPONGE T-LAP 18X18 ~~LOC~~+RFID (SPONGE) ×2 IMPLANT
SUCTION FRAZIER HANDLE 10FR (MISCELLANEOUS) ×2
SUCTION TUBE FRAZIER 10FR DISP (MISCELLANEOUS) ×1 IMPLANT
SUT ETHILON 2 0 PSLX (SUTURE) ×6 IMPLANT
TOWEL GREEN STERILE (TOWEL DISPOSABLE) ×2 IMPLANT
TOWEL GREEN STERILE FF (TOWEL DISPOSABLE) ×2 IMPLANT
TUBE CONNECTING 12X1/4 (SUCTIONS) ×2 IMPLANT
WATER STERILE IRR 1000ML POUR (IV SOLUTION) ×2 IMPLANT

## 2021-07-09 NOTE — Anesthesia Postprocedure Evaluation (Signed)
Anesthesia Post Note  Patient: RIDDIK SENNA  Procedure(s) Performed: RIGHT MIDFOOT FUSION (Right: Foot)     Patient location during evaluation: PACU Anesthesia Type: Regional and MAC Level of consciousness: awake and alert Pain management: pain level controlled Vital Signs Assessment: post-procedure vital signs reviewed and stable Respiratory status: spontaneous breathing, nonlabored ventilation and respiratory function stable Cardiovascular status: stable and blood pressure returned to baseline Postop Assessment: no apparent nausea or vomiting Anesthetic complications: no   No notable events documented.  Last Vitals:  Vitals:   07/09/21 1218 07/09/21 1229  BP: 113/78   Pulse: (!) 52   Resp: 12   Temp:  (!) 36.3 C  SpO2: 100%     Last Pain:  Vitals:   07/09/21 1229  TempSrc:   PainSc: 0-No pain                 Davy Westmoreland,W. EDMOND

## 2021-07-09 NOTE — Op Note (Signed)
07/09/2021  11:36 AM  PATIENT:  Chase Greer    PRE-OPERATIVE DIAGNOSIS:  Lisfranc fracture dislocation Right Foot  POST-OPERATIVE DIAGNOSIS:  Same  PROCEDURE:  RIGHT MIDFOOT FUSION Application 13 cm Prevena wound VAC. C-arm fluoroscopy to verify reduction.    SURGEON:  Nadara Mustard, MD  PHYSICIAN ASSISTANT:None ANESTHESIA:   General  PREOPERATIVE INDICATIONS:  QUILLAN WHITTER is a  30 y.o. male with a diagnosis of Lisfranc Dislocation Right Foot who failed conservative measures and elected for surgical management.    The risks benefits and alternatives were discussed with the patient preoperatively including but not limited to the risks of infection, bleeding, nerve injury, cardiopulmonary complications, the need for revision surgery, among others, and the patient was willing to proceed.  OPERATIVE IMPLANTS: Alps foot fusion plate  @ENCIMAGES @  OPERATIVE FINDINGS: C-arm fluoroscopy verified reduction of the Lisfranc fracture dislocation.  OPERATIVE PROCEDURE: Patient was brought the operating room after undergoing a regional anesthetic.  After adequate levels anesthesia were obtained patient's right lower extremity was prepped using DuraPrep draped into a sterile field a timeout was called.  A dorsal incision was made over the first webspace.  Blunt dissection was carried down to the base of the first metatarsal medial cuneiform.  Subperiosteal dissection was then used to debride the Lisfranc joint.  Retractors were placed to protect the dorsal neurovascular bundle.  The base of the first metatarsal medial cuneiform had a fracture dislocation.  A oscillating saw was used to resect the articular cartilage from the dislocated joint the joint was reduced and provisionally stabilized with a K wire.  Attention was then focused on the base of the second metatarsal this was cleansed reduced and provisionally stabilized with a K wire.  A compression screw was then placed from  the medial cuneiforms through the base of the second metatarsal to compress the Lisfranc complex.  A dorsal plate was then applied with screws dorsally along the first and second metatarsal as well as the middle and medial cuneiforms.  C-arm fluoroscopy verified reduction.  The wound was irrigated with normal saline incision was closed using 2-0 nylon 13 cm Prevena wound VAC was applied this had a good suction fit this was overwrapped with Coban patient was taken the PACU in stable condition.   DISCHARGE PLANNING:  Antibiotic duration: Preoperative antibiotics  Weightbearing: Nonweightbearing on the right  Pain medication: Prescription for Vicodin  Dressing care/ Wound VAC: Continue wound VAC for 1 week  Ambulatory devices: Crutches  Discharge to: Home.  Follow-up: In the office 1 week post operative.

## 2021-07-09 NOTE — Transfer of Care (Signed)
Immediate Anesthesia Transfer of Care Note  Patient: Chase Greer  Procedure(s) Performed: RIGHT MIDFOOT FUSION (Right: Foot)  Patient Location: PACU  Anesthesia Type:MAC combined with regional for post-op pain  Level of Consciousness: drowsy and patient cooperative  Airway & Oxygen Therapy: Patient Spontanous Breathing and Patient connected to nasal cannula oxygen  Post-op Assessment: Report given to RN and Post -op Vital signs reviewed and stable  Post vital signs: Reviewed and stable  Last Vitals:  Vitals Value Taken Time  BP 104/68 07/09/21 1133  Temp    Pulse 62 07/09/21 1136  Resp 17 07/09/21 1136  SpO2 100 % 07/09/21 1136  Vitals shown include unvalidated device data.  Last Pain:  Vitals:   07/09/21 0720  TempSrc:   PainSc: 9          Complications: No notable events documented.
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
## 2021-07-09 NOTE — Anesthesia Procedure Notes (Signed)
Procedure Name: MAC Date/Time: 07/09/2021 9:55 AM Performed by: Michele Rockers, CRNA Pre-anesthesia Checklist: Patient identified, Emergency Drugs available, Suction available, Timeout performed and Patient being monitored Patient Re-evaluated:Patient Re-evaluated prior to induction Oxygen Delivery Method: Simple face mask

## 2021-07-09 NOTE — Progress Notes (Signed)
Per Dr. Aleene Davidson, no need for lab work.

## 2021-07-09 NOTE — Anesthesia Procedure Notes (Addendum)
Anesthesia Regional Block: Popliteal block   Pre-Anesthetic Checklist: , timeout performed,  Correct Patient, Correct Site, Correct Laterality,  Correct Procedure, Correct Position, site marked,  Risks and benefits discussed,  Pre-op evaluation,  At surgeon's request and post-op pain management  Laterality: Right  Prep: Maximum Sterile Barrier Precautions used, chloraprep       Needles:  Injection technique: Single-shot  Needle Type: Echogenic Stimulator Needle     Needle Length: 9cm  Needle Gauge: 21     Additional Needles:   Procedures:,,,, ultrasound used (permanent image in chart),,    Narrative:  Start time: 07/09/2021 8:36 AM End time: 07/09/2021 8:46 AM Injection made incrementally with aspirations every 5 mL. Anesthesiologist: Gaynelle Adu, MD  Additional Notes:  Adductor canal block with 10cc of 0.5% Bupivicaine plain w/1:200k epi.

## 2021-07-09 NOTE — Anesthesia Preprocedure Evaluation (Addendum)
Anesthesia Evaluation  Patient identified by MRN, date of birth, ID band Patient awake    Reviewed: Allergy & Precautions, H&P , NPO status , Patient's Chart, lab work & pertinent test results  Airway Mallampati: II  TM Distance: >3 FB Neck ROM: Full    Dental no notable dental hx. (+) Teeth Intact, Dental Advisory Given   Pulmonary neg pulmonary ROS, former smoker,    Pulmonary exam normal breath sounds clear to auscultation       Cardiovascular negative cardio ROS   Rhythm:Regular Rate:Normal     Neuro/Psych negative neurological ROS  negative psych ROS   GI/Hepatic negative GI ROS, Neg liver ROS,   Endo/Other  negative endocrine ROS  Renal/GU negative Renal ROS  negative genitourinary   Musculoskeletal   Abdominal   Peds  Hematology negative hematology ROS (+)   Anesthesia Other Findings   Reproductive/Obstetrics negative OB ROS                            Anesthesia Physical Anesthesia Plan  ASA: 1  Anesthesia Plan: MAC and Regional   Post-op Pain Management: Regional block and Tylenol PO (pre-op)   Induction: Intravenous  PONV Risk Score and Plan: 1 and Ondansetron, Dexamethasone, Propofol infusion and Midazolam  Airway Management Planned: Simple Face Mask  Additional Equipment:   Intra-op Plan:   Post-operative Plan:   Informed Consent: I have reviewed the patients History and Physical, chart, labs and discussed the procedure including the risks, benefits and alternatives for the proposed anesthesia with the patient or authorized representative who has indicated his/her understanding and acceptance.     Dental advisory given  Plan Discussed with: CRNA  Anesthesia Plan Comments:         Anesthesia Quick Evaluation

## 2021-07-14 ENCOUNTER — Telehealth: Payer: Self-pay | Admitting: Radiology

## 2021-07-14 ENCOUNTER — Encounter (HOSPITAL_COMMUNITY): Payer: Self-pay | Admitting: Orthopedic Surgery

## 2021-07-14 NOTE — Telephone Encounter (Signed)
Patient called triage line this morning with questions regarding wound vac and elevation. He states that the wound vac is not suctioning as much as it was previously and it does not look to be pulling a lot of blood out any more. I explained that this is normal and as long as the vac is still running with no errors, it should be fine until his follow up appointment on Wednesday.  He also wanted to know if he could get up and move around. I explained that he is able to get up for short periods as long as he is NWB on the right, however, I did explain that the more he was up, the more swelling he would have which would in turn increase his pain. Advised to keep elevated above the heart as much as possible. Patient expressed understanding.

## 2021-07-16 ENCOUNTER — Other Ambulatory Visit: Payer: Self-pay

## 2021-07-16 ENCOUNTER — Encounter: Payer: Self-pay | Admitting: Family

## 2021-07-16 ENCOUNTER — Ambulatory Visit (INDEPENDENT_AMBULATORY_CARE_PROVIDER_SITE_OTHER): Payer: Self-pay | Admitting: Family

## 2021-07-16 DIAGNOSIS — S93324D Dislocation of tarsometatarsal joint of right foot, subsequent encounter: Secondary | ICD-10-CM

## 2021-07-16 NOTE — Progress Notes (Signed)
   Post-Op Visit Note   Patient: Chase Greer           Date of Birth: 02-03-1991           MRN: [id number] Visit Date: 07/16/2021 PCP: Patient, No Pcp Per (Inactive)  Chief Complaint: No chief complaint on file.   HPI:  HPI The patient is a 30 year old gentleman seen status post right midfoot fusion for Lisfranc dislocation on November 23.  Has been nonweightbearing with crutches does have a Cam walker.  Wound VAC removed today. Ortho Exam Incision well approximated sutures there is moderate edema no gaping or drainage no sign of infection  Visit Diagnoses:  1. Dislocation of tarsometatarsal joint of right foot, subsequent encounter     Plan: Begin daily Dial soap cleansing.  Dry dressing changes.  Nonweightbearing continue CAM Walker and crutches.  Follow-up in 1 week with radiographs of the foot.  Follow-Up Instructions: Return in about 1 week (around 07/23/2021).   Imaging: No results found.  Orders:  No orders of the defined types were placed in this encounter.  No orders of the defined types were placed in this encounter.    PMFS History: Patient Active Problem List   Diagnosis Date Noted   Lisfranc dislocation, right, initial encounter    Left anterior cruciate ligament tear 07/02/2016   Acute medial meniscal injury of knee, left, initial encounter 07/02/2016   Complex tear of lateral meniscus of left knee as current injury 07/02/2016   Past Medical History:  Diagnosis Date   ACL tear    COVID 05/09/2020   Meniscus, lateral, derangement, left     Family History  Problem Relation Age of Onset   Hypertension Mother    Heart failure Mother    Hypertension Father     Past Surgical History:  Procedure Laterality Date   FINGER SURGERY Left    left ring finger   FOOT ARTHRODESIS Right 07/09/2021   Procedure: RIGHT MIDFOOT FUSION;  Surgeon: Nadara Mustard, MD;  Location: [facility] OR;  Service: Orthopedics;  Laterality: Right;   Social History    Occupational History   Not on file  Tobacco Use   Smoking status: Former   Smokeless tobacco: Never  Vaping Use   Vaping Use: Never used  Substance and Sexual Activity   Alcohol use: Not Currently    Comment: occ   Drug use: Not Currently   Sexual activity: Not on file

## 2021-07-17 ENCOUNTER — Encounter: Payer: Self-pay | Admitting: Orthopedic Surgery

## 2021-07-23 ENCOUNTER — Telehealth: Payer: Self-pay | Admitting: Radiology

## 2021-07-23 NOTE — Telephone Encounter (Signed)
Pt informed that Dr. Lajoyce Corners is not in office today, he is in surgery all day today. I did advise him to keep his leg elevated above chest level as much as possible today and we will follow up with him in office tomorrow. He understood.
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
## 2021-07-24 ENCOUNTER — Ambulatory Visit (INDEPENDENT_AMBULATORY_CARE_PROVIDER_SITE_OTHER): Payer: Self-pay | Admitting: Orthopedic Surgery

## 2021-07-24 ENCOUNTER — Ambulatory Visit (INDEPENDENT_AMBULATORY_CARE_PROVIDER_SITE_OTHER): Payer: Self-pay

## 2021-07-24 DIAGNOSIS — S93324A Dislocation of tarsometatarsal joint of right foot, initial encounter: Secondary | ICD-10-CM

## 2021-07-29 ENCOUNTER — Encounter: Payer: Self-pay | Admitting: Orthopedic Surgery

## 2021-07-29 ENCOUNTER — Telehealth: Payer: Self-pay
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
## 2021-07-29 NOTE — Telephone Encounter (Signed)
Pt called and he would like a rx sent in for a scooter, he said the crutches are hard to get around with.   Please advise

## 2021-07-30 NOTE — Telephone Encounter (Signed)
lmtcb

## 2021-08-01 ENCOUNTER — Telehealth: Payer: Self-pay | Admitting: Orthopedic Surgery

## 2021-08-01 ENCOUNTER — Ambulatory Visit (HOSPITAL_COMMUNITY)
Admission: EM | Admit: 2021-08-01 | Discharge: 2021-08-01 | Disposition: A | Discharge status: Home / Self Care | Payer: Self-pay | Attending: Emergency Medicine | Admitting: Emergency Medicine

## 2021-08-01 ENCOUNTER — Encounter (HOSPITAL_COMMUNITY): Payer: Self-pay

## 2021-08-01 ENCOUNTER — Other Ambulatory Visit: Payer: Self-pay

## 2021-08-01 DIAGNOSIS — H60391 Other infective otitis externa, right ear: Secondary | ICD-10-CM

## 2021-08-01 MED ORDER — OFLOXACIN 0.3 % OT SOLN: 5.0000 [drp] | Freq: Two times a day (BID) | OTIC | 0 refills | Status: DC

## 2021-08-01 NOTE — Discharge Instructions (Signed)
Place 5 drops into your right ear twice a day for the next 7 days  Keep your ears dry. Use the corner of a towel to dry your ears after you swim or bathe. Try not to scratch or put things in your ear. Doing these things makes it easier for germs to grow in your ear.  May follow-up with urgent care if symptoms persist

## 2021-08-01 NOTE — Telephone Encounter (Signed)
Pt was advised by Autumn in office that he can go to [employer] and pick out a scooter to meet his needs. He can rent this on a week to week basis or monthly. He expressed his understanding.

## 2021-08-01 NOTE — ED Triage Notes (Signed)
Pt presents to the office for right ear pain. No injury to his ear.

## 2021-08-01 NOTE — ED Provider Notes (Signed)
[facility]    CSN: [id number] Arrival date & time: 08/01/21  0347      History   Chief Complaint No chief complaint on file.   HPI Chase Greer is a 30 y.o. male.   Patient presents with right ear pain ,drainage and muffled hearing for 1 day. Denies itching, fever, chills, URI symptoms.  Has not attempted treatment.  No pertinent medical history  Past Medical History:  Diagnosis Date   ACL tear    COVID 05/09/2020   Meniscus, lateral, derangement, left     Patient Active Problem List   Diagnosis Date Noted   Lisfranc dislocation, right, initial encounter    Left anterior cruciate ligament tear 07/02/2016   Acute medial meniscal injury of knee, left, initial encounter 07/02/2016   Complex tear of lateral meniscus of left knee as current injury 07/02/2016    Past Surgical History:  Procedure Laterality Date   FINGER SURGERY Left    left ring finger   FOOT ARTHRODESIS Right 07/09/2021   Procedure: RIGHT MIDFOOT FUSION;  Surgeon: Nadara Mustard, MD;  Location: [facility] OR;  Service: Orthopedics;  Laterality: Right;       Home Medications    Prior to Admission medications   Medication Sig Start Date End Date Taking? Authorizing Provider  HYDROcodone-acetaminophen (NORCO/VICODIN) 5-325 MG tablet Take 1 tablet by mouth every 4 (four) hours as needed. 07/09/21   Nadara Mustard, MD    Family History Family History  Problem Relation Age of Onset   Hypertension Mother    Heart failure Mother    Hypertension Father     Social History Social History   Tobacco Use   Smoking status: Former   Smokeless tobacco: Never  Building services engineer Use: Never used  Substance Use Topics   Alcohol use: Not Currently    Comment: occ   Drug use: Not Currently     Allergies   Patient has no known allergies.   Review of Systems Review of Systems  Constitutional: Negative.   HENT:  Positive for ear discharge and ear pain. Negative for congestion, dental  problem, drooling, facial swelling, hearing loss, mouth sores, nosebleeds, postnasal drip, rhinorrhea, sinus pressure, sinus pain, sneezing, sore throat, tinnitus, trouble swallowing and voice change.   Respiratory: Negative.    Cardiovascular: Negative.   Gastrointestinal: Negative.   Skin: Negative.   Neurological: Negative.     Physical Exam Triage Vital Signs ED Triage Vitals  Enc Vitals Group     BP 08/01/21 0850 112/68     Pulse --      Resp 08/01/21 0850 16     Temp 08/01/21 0850 98 F (36.7 C)     Temp Source 08/01/21 0850 Oral     SpO2 08/01/21 0850 100 %     Weight --      Height --      Head Circumference --      Peak Flow --      Pain Score 08/01/21 0852 9     Pain Loc --      Pain Edu? --      Excl. in GC? --    No data found.  Updated Vital Signs BP 112/68 (BP Location: Left Arm)    Temp 98 F (36.7 C) (Oral)    Resp 16    SpO2 100%   Visual Acuity Right Eye Distance:   Left Eye Distance:   Bilateral Distance:    Right Eye  Near:   Left Eye Near:    Bilateral Near:     Physical Exam Constitutional:      Appearance: Normal appearance. He is normal weight.  HENT:     Right Ear: Hearing and tympanic membrane normal. Swelling and tenderness present.     Left Ear: Hearing, tympanic membrane, ear canal and external ear normal.  Eyes:     Extraocular Movements: Extraocular movements intact.  Pulmonary:     Effort: Pulmonary effort is normal.  Skin:    General: Skin is warm and dry.  Neurological:     Mental Status: He is alert and oriented to person, place, and time. Mental status is at baseline.  Psychiatric:        Mood and Affect: Mood normal.        Behavior: Behavior normal.     UC Treatments / Results  Labs (all labs ordered are listed, but only abnormal results are displayed) Labs Reviewed - No data to display  EKG   Radiology No results found.  Procedures Procedures (including critical care time)  Medications Ordered in  UC Medications - No data to display  Initial Impression / Assessment and Plan / UC Course  I have reviewed the triage vital signs and the nursing notes.  Pertinent labs & imaging results that were available during my care of the patient were reviewed by me and considered in my medical decision making (see chart for details).  Effective otitis externa of the right ear  1.  ofloxacin 0.3% otic solution 5 drops right ear twice daily for 7 days 2.  Recommended keeping ears as dry as possible and discontinuation of Q-tips 3.  Urgent care follow-up as needed 4.  Note given for lawyer for proof of visit Final Clinical Impressions(s) / UC Diagnoses   Final diagnoses:  None   Discharge Instructions   None    ED Prescriptions   None    PDMP not reviewed this encounter.   Valinda Hoar, NP 08/01/21 [phone]

## 2021-08-01 NOTE — Telephone Encounter (Signed)
Pt submitted medical release form, and paid $25.00 cash payment to [employer]. Pt emailed short term disability to Tammy H. Accepted 08/01/21

## 2021-08-07 ENCOUNTER — Telehealth: Payer: Self-pay

## 2021-08-07 NOTE — Telephone Encounter (Signed)
Patient called triage phone stating he is waiting for his form, I told him to call [employer] and he said they told him it was completed, just waiting for a signature. He was demanding to speak to Dr. Lajoyce Corners; I told him Dr. Lajoyce Corners had nothing to do with forms.

## 2021-08-07 NOTE — Telephone Encounter (Signed)
I called pt to advise that we just received his forms this morning. Dr. Lajoyce Corners completed highlighted areas and signed. I advised the pt that I would fax for him but all of the papers are not attached to this form. There is no fax number that I can send this to. Can you ask that they send these forms in today or have [employer] give me the fax number so that I can fax it for him.

## 2021-08-07 NOTE — Telephone Encounter (Signed)
I have form and relayed this to [facility] with [employer] to make sure forms are faxed asap.

## 2021-08-21 ENCOUNTER — Ambulatory Visit (INDEPENDENT_AMBULATORY_CARE_PROVIDER_SITE_OTHER): Payer: Self-pay

## 2021-08-21 ENCOUNTER — Ambulatory Visit (INDEPENDENT_AMBULATORY_CARE_PROVIDER_SITE_OTHER): Payer: Self-pay | Admitting: Orthopedic Surgery

## 2021-08-21 ENCOUNTER — Other Ambulatory Visit: Payer: Self-pay

## 2021-08-21 ENCOUNTER — Ambulatory Visit: Payer: Self-pay | Admitting: Orthopedic Surgery

## 2021-08-21 DIAGNOSIS — M79671 Pain in right foot: Secondary | ICD-10-CM

## 2021-08-21 DIAGNOSIS — S93324A Dislocation of tarsometatarsal joint of right foot, initial encounter: Secondary | ICD-10-CM

## 2021-09-18 ENCOUNTER — Ambulatory Visit (INDEPENDENT_AMBULATORY_CARE_PROVIDER_SITE_OTHER): Payer: Self-pay | Admitting: Orthopedic Surgery

## 2021-09-18 ENCOUNTER — Encounter: Payer: Self-pay | Admitting: Orthopedic Surgery

## 2021-09-18 ENCOUNTER — Other Ambulatory Visit: Payer: Self-pay

## 2021-09-18 DIAGNOSIS — S93324A Dislocation of tarsometatarsal joint of right foot, initial encounter: Secondary | ICD-10-CM

## 2021-09-18 NOTE — Progress Notes (Signed)
Office Visit Note   Patient: Chase Greer           Date of Birth: 1991/08/11           MRN: [id number] Visit Date: 09/18/2021              Requested by: No referring provider defined for this encounter. PCP: Patient, No Pcp Per (Inactive)  Chief Complaint  Patient presents with   Right Foot - Routine Post Op    07/09/21 right mid foot fusion       HPI: Patient is a 31 year old gentleman who is 9 weeks status post internal fixation for Lisfranc fracture dislocation he is currently weightbearing in a short fracture boot he states he still has pain.  Assessment & Plan: Visit Diagnoses:  1. Lisfranc dislocation, right, initial encounter     Plan: Patient would like to return to work he is given a note that he may return to work light duty maximum lifting 50 pounds sit as needed for the next 4 weeks.  Follow-up in 4 weeks with repeat three-view radiographs of the right foot.  Follow-Up Instructions: Return in about 4 weeks (around 10/16/2021).   Ortho Exam  Patient is alert, oriented, no adenopathy, well-dressed, normal affect, normal respiratory effort. Examination the incision is well-healed there is still some swelling in the foot distraction across the Lisfranc complex is nonpainful.  Imaging: No results found. No images are attached to the encounter.  Labs: No results found for: HGBA1C, ESRSEDRATE, CRP, LABURIC, REPTSTATUS, GRAMSTAIN, CULT, LABORGA   No results found for: ALBUMIN, PREALBUMIN, CBC  No results found for: MG No results found for: VD25OH  No results found for: PREALBUMIN CBC EXTENDED Latest Ref Rng & Units 03/02/2013 03/02/2013 09/05/2008  WBC 4.0 - 10.5 K/uL - 7.3 -  RBC 4.22 - 5.81 MIL/uL - 5.25 -  HGB 13.0 - 17.0 g/dL 76.2 83.1 19.4(H)  HCT 39.0 - 52.0 % 49.0 43.2 57.0(H)  PLT 150 - 400 K/uL - PLATELET CLUMPS NOTED ON SMEAR, UNABLE TO ESTIMATE -  NEUTROABS 1.7 - 7.7 K/uL - 5.0 -  LYMPHSABS 0.7 - 4.0 K/uL - 1.7 -     There is no height  or weight on file to calculate BMI.  Orders:  No orders of the defined types were placed in this encounter.  No orders of the defined types were placed in this encounter.    Procedures: No procedures performed  Clinical Data: No additional findings.  ROS:  All other systems negative, except as noted in the HPI. Review of Systems  Objective: Vital Signs: There were no vitals taken for this visit.  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  PMFS History: Patient Active Problem List   Diagnosis Date Noted   Lisfranc dislocation, right, initial encounter    Left anterior cruciate ligament tear 07/02/2016   Acute medial meniscal injury of knee, left, initial encounter 07/02/2016   Complex tear of lateral meniscus of left knee as current injury 07/02/2016   Past Medical History:  Diagnosis Date   ACL tear    COVID 05/09/2020   Meniscus, lateral, derangement, left     Family History  Problem Relation Age of Onset   Hypertension Mother    Heart failure Mother    Hypertension Father     Past Surgical History:  Procedure Laterality Date  FINGER SURGERY Left    left ring finger   FOOT ARTHRODESIS Right 07/09/2021   Procedure: RIGHT MIDFOOT FUSION;  Surgeon: Nadara Mustard, MD;  Location: [facility];  Service: Orthopedics;  Laterality: Right;   Social History   Occupational History   Not on file  Tobacco Use   Smoking status: Former   Smokeless tobacco: Never  Vaping Use   Vaping Use: Never used  Substance and Sexual Activity   Alcohol use: Not Currently    Comment: occ   Drug use: Not Currently   Sexual activity: Not on file

## 2021-09-21 ENCOUNTER — Encounter: Payer: Self-pay | Admitting: Orthopedic Surgery

## 2021-09-21 NOTE — Progress Notes (Signed)
Office Visit Note   Patient: Chase Greer           Date of Birth: [birth year]           MRN: [id number] Visit Date: 08/21/2021              Requested by: No referring provider defined for this encounter. PCP: Patient, No Pcp Per (Inactive)  Chief Complaint  Patient presents with   Right Foot - Routine Post Op    07/09/21 right mid foot fusion s/p lisfranc fx dislocation       HPI: Patient is a 31 year old gentleman status post right midfoot fusion for fracture dislocation across the Lisfranc joint.  Patient is currently nonweightbearing with crutches and a fracture boot.  Assessment & Plan: Visit Diagnoses:  1. Pain in right foot   2. Lisfranc dislocation, right, initial encounter     Plan: Patient will advance to weightbearing as tolerated in the fracture boot repeat three-view radiographs of the right foot at follow-up.  Follow-Up Instructions: Return in about 4 weeks (around 09/18/2021).   Ortho Exam  Patient is alert, oriented, no adenopathy, well-dressed, normal affect, normal respiratory effort. Examination the incision is well-healed there is no redness cellulitis or signs of infection.  Imaging: No results found. No images are attached to the encounter.  Labs: No results found for: HGBA1C, ESRSEDRATE, CRP, LABURIC, REPTSTATUS, GRAMSTAIN, CULT, LABORGA   No results found for: ALBUMIN, PREALBUMIN, CBC  No results found for: MG No results found for: VD25OH  No results found for: PREALBUMIN CBC EXTENDED Latest Ref Rng & Units 03/02/2013 03/02/2013 09/05/2008  WBC 4.0 - 10.5 K/uL - 7.3 -  RBC 4.22 - 5.81 MIL/uL - 5.25 -  HGB 13.0 - 17.0 g/dL 93.7 16.9 19.4(H)  HCT 39.0 - 52.0 % 49.0 43.2 57.0(H)  PLT 150 - 400 K/uL - PLATELET CLUMPS NOTED ON SMEAR, UNABLE TO ESTIMATE -  NEUTROABS 1.7 - 7.7 K/uL - 5.0 -  LYMPHSABS 0.7 - 4.0 K/uL - 1.7 -     There is no height or weight on file to calculate BMI.  Orders:  Orders Placed This Encounter  Procedures    XR Foot Complete Right   No orders of the defined types were placed in this encounter.    Procedures: No procedures performed  Clinical Data: No additional findings.  ROS:  All other systems negative, except as noted in the HPI. Review of Systems  Objective: Vital Signs: There were no vitals taken for this visit.  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  PMFS History: Patient Active Problem List   Diagnosis Date Noted   Lisfranc dislocation, right, initial encounter    Left anterior cruciate ligament tear 07/02/2016   Acute medial meniscal injury of knee, left, initial encounter 07/02/2016   Complex tear of lateral meniscus of left knee as current injury 07/02/2016   Past Medical History:  Diagnosis Date   ACL tear    COVID 05/09/2020   Meniscus, lateral, derangement, left     Family History  Problem Relation Age of Onset   Hypertension Mother    Heart failure Mother    Hypertension Father     Past Surgical History:  Procedure Laterality Date   FINGER SURGERY Left    left ring finger   FOOT ARTHRODESIS Right 07/09/2021   Procedure: RIGHT MIDFOOT FUSION;  Surgeon: Nadara Mustard, MD;  Location: [facility] OR;  Service: Orthopedics;  Laterality: Right;   Social History   Occupational History   Not on file  Tobacco Use   Smoking status: Former   Smokeless tobacco: Never  Vaping Use   Vaping Use: Never used  Substance and Sexual Activity   Alcohol use: Not Currently    Comment: occ   Drug use: Not Currently   Sexual activity: Not on file

## 2021-10-16 ENCOUNTER — Ambulatory Visit (INDEPENDENT_AMBULATORY_CARE_PROVIDER_SITE_OTHER): Payer: Self-pay

## 2021-10-16 ENCOUNTER — Ambulatory Visit (INDEPENDENT_AMBULATORY_CARE_PROVIDER_SITE_OTHER): Payer: Self-pay | Admitting: Orthopedic Surgery

## 2021-10-16 ENCOUNTER — Encounter: Payer: Self-pay | Admitting: Orthopedic Surgery

## 2021-10-16 DIAGNOSIS — M79671 Pain in right foot: Secondary | ICD-10-CM

## 2021-10-16 NOTE — Progress Notes (Signed)
? ?Office Visit Note ?  ?Patient: Chase Greer           ?Date of Birth: [birth year]           ?MRN: [id number] ?Visit Date: 10/16/2021 ?             ?Requested by: No referring provider defined for this encounter. ?PCP: Patient, No Pcp Per (Inactive) ? ?Chief Complaint  ?Patient presents with  ? Right Foot - Follow-up  ? ? ? ? ?HPI: ?Patient is a 31 year old gentleman who is about 14 weeks status post right midfoot fusion from a fracture dislocation across the Lisfranc complex.  Patient states he still has pain still has a limp. ? ?Assessment & Plan: ?Visit Diagnoses:  ?1. Pain in right foot   ? ? 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  Do not anticipate patient being able to work standing on his feet. ? ?Follow-Up Instructions: Return if symptoms worsen or fail to improve.  ? ?Ortho Exam ? ?Patient is alert, oriented, no adenopathy, well-dressed, normal affect, normal respiratory effort. ?Examination patient has a good dorsalis pedis pulse he has good ankle good subtalar motion that is not painful to palpation.  He does have swelling over the dorsum of his foot and has pain to palpation over the areas with swelling.  There is no open ulcer no drainage no signs of infection. ? ?Imaging: ?XR Foot Complete Right ? ?Result Date: 10/16/2021 ?Three-view radiographs of the right foot shows a well-healed medial column fusion status post Lisfranc fracture dislocation.  ?No images are attached to the encounter. ? ?Labs: ?No results found for: HGBA1C, ESRSEDRATE, CRP, LABURIC, REPTSTATUS, GRAMSTAIN, CULT, LABORGA ? ? ?No results found for: ALBUMIN, PREALBUMIN, CBC ? ?No results found for: MG ?No results found for: VD25OH ? ?No results found for: PREALBUMIN ?CBC EXTENDED Latest Ref Rng & Units 03/02/2013 03/02/2013 09/05/2008  ?WBC 4.0 - 10.5 K/uL - 7.3 -  ?RBC 4.22 - 5.81 MIL/uL - 5.25 -  ?HGB 13.0 - 17.0 g/dL 16.7 15.9 19.4(H)  ?HCT 39.0 - 52.0 % 49.0 43.2 57.0(H)   ?PLT 150 - 400 K/uL - PLATELET CLUMPS NOTED ON SMEAR, UNABLE TO ESTIMATE -  ?NEUTROABS 1.7 - 7.7 K/uL - 5.0 -  ?LYMPHSABS 0.7 - 4.0 K/uL - 1.7 -  ? ? ? ?There is no height or weight on file to calculate BMI. ? ?Orders:  ?Orders Placed This Encounter  ?Procedures  ? XR Foot Complete Right  ? ?No orders of the defined types were placed in this encounter. ? ? ? Procedures: ?No procedures performed ? ?Clinical Data: ?No additional findings. ? ?ROS: ? ?All other systems negative, except as noted in the HPI. ?Review of Systems ? ?Objective: ?Vital Signs: There were no vitals taken for this visit. ? 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 ? ?PMFS History: ?Patient Active Problem List  ? Diagnosis Date Noted  ? Lisfranc dislocation, right, initial encounter   ? Left anterior cruciate ligament tear 07/02/2016  ? Acute medial meniscal injury of knee, left, initial encounter 07/02/2016  ? Complex tear of lateral meniscus of left knee as current injury 07/02/2016  ? ?Past Medical History:  ?Diagnosis Date  ? ACL tear   ? COVID 05/09/2020  ? Meniscus, lateral, derangement, left   ?  ?Family  History  ?Problem Relation Age of Onset  ? Hypertension Mother   ? Heart failure Mother   ? Hypertension Father   ?  ?Past Surgical History:  ?Procedure Laterality Date  ? FINGER SURGERY Left   ? left ring finger  ? FOOT ARTHRODESIS Right 07/09/2021  ? Procedure: RIGHT MIDFOOT FUSION;  Surgeon: Newt Minion, MD;  Location: [facility];  Service: Orthopedics;  Laterality: Right;  ? ?Social History  ? ?Occupational History  ? Not on file  ?Tobacco Use  ? Smoking status: Former  ? Smokeless tobacco: Never  ?Vaping Use  ? Vaping Use: Never used  ?Substance and Sexual Activity  ? Alcohol use: Not Currently  ?  Comment: occ  ? Drug use: Not Currently  ? Sexual activity: Not on file   ? ? ? ? ? ?

## 2022-06-27 ENCOUNTER — Ambulatory Visit (INDEPENDENT_AMBULATORY_CARE_PROVIDER_SITE_OTHER): Payer: Self-pay

## 2022-06-27 ENCOUNTER — Encounter (HOSPITAL_COMMUNITY): Payer: Self-pay

## 2022-06-27 ENCOUNTER — Ambulatory Visit (HOSPITAL_COMMUNITY)
Admission: EM | Admit: 2022-06-27 | Discharge: 2022-06-27 | Disposition: A | Discharge status: Home / Self Care | Payer: Self-pay | Attending: Internal Medicine | Admitting: Internal Medicine

## 2022-06-27 DIAGNOSIS — S99921A Unspecified injury of right foot, initial encounter: Secondary | ICD-10-CM

## 2022-06-27 DIAGNOSIS — M79671 Pain in right foot: Secondary | ICD-10-CM

## 2022-06-27 NOTE — Discharge Instructions (Addendum)
Call Dr. Audrie Lia office to schedule a follow-up appointment outpatient to have the screw repaired.  We have placed you in a postop shoe today to help avoid pressure on the screw.  You may take Tylenol or ibuprofen as needed for any pain you may experience.  If you experience any skin breakdown or wound to the swollen area, please follow-up with urgent care for reevaluation.  Call Dr. Lajoyce Corners on Monday to schedule an appointment for soon as possible.

## 2022-06-27 NOTE — ED Triage Notes (Signed)
Pt reports right foot pain and swelling after a pack of plastic fell on his foot.

## 2022-06-27 NOTE — ED Provider Notes (Signed)
[facility]    CSN: [id number] Arrival date & time: 06/27/22  1001      History   Chief Complaint Chief Complaint  Patient presents with   Foot Injury    HPI Chase Greer is a 31 y.o. male.      [phone]     Past Medical History:  Diagnosis Date   ACL tear    COVID 05/09/2020   Meniscus, lateral, derangement, left     Patient Active Problem List   Diagnosis Date Noted   Lisfranc dislocation, right, initial encounter    Left anterior cruciate ligament tear 07/02/2016   Acute medial meniscal injury of knee, left, initial encounter 07/02/2016   Complex tear of lateral meniscus of left knee as current injury 07/02/2016    Past Surgical History:  Procedure Laterality Date   FINGER SURGERY Left    left ring finger   FOOT ARTHRODESIS Right 07/09/2021   Procedure: RIGHT MIDFOOT FUSION;  Surgeon: Nadara Mustard, MD;  Location: [facility] OR;  Service: Orthopedics;  Laterality: Right;       Home Medications    Prior to Admission medications   Medication Sig Start Date End Date Taking? Authorizing Provider  HYDROcodone-acetaminophen (NORCO/VICODIN) 5-325 MG tablet Take 1 tablet by mouth every 4 (four) hours as needed. 07/09/21   Nadara Mustard, MD  ofloxacin (FLOXIN) 0.3 % OTIC solution Place 5 drops into the right ear 2 (two) times daily. 08/01/21   Valinda Hoar, NP    Family History Family History  Problem Relation Age of Onset   Hypertension Mother    Heart failure Mother    Hypertension Father     Social History Social History   Tobacco Use   Smoking status: Former   Smokeless tobacco: Never  Building services engineer Use: Never used  Substance Use Topics   Alcohol use: Not Currently    Comment: occ   Drug use: Not Currently     Allergies   Patient has no known allergies.   Review of Systems Review of Systems   Physical Exam Triage Vital Signs ED Triage Vitals [06/27/22 1012]  Enc Vitals Group     BP 126/70      Pulse Rate (!) 55     Resp 18     Temp 97.6 F (36.4 C)     Temp Source Oral     SpO2 99 %     Weight      Height      Head Circumference      Peak Flow      Pain Score      Pain Loc      Pain Edu?      Excl. in GC?    No data found.  Updated Vital Signs BP 126/70 (BP Location: Left Arm)   Pulse (!) 55   Temp 97.6 F (36.4 C) (Oral)   Resp 18   SpO2 99%   Visual Acuity Right Eye Distance:   Left Eye Distance:   Bilateral Distance:    Right Eye Near:   Left Eye Near:    Bilateral Near:     Physical Exam Vitals and nursing note reviewed.  Constitutional:      Appearance: He is not ill-appearing or toxic-appearing.  HENT:     Head: Normocephalic and atraumatic.     Right Ear: Hearing and external ear normal.     Left Ear: Hearing and external ear normal.  Nose: Nose normal.     Mouth/Throat:     Lips: Pink.     Mouth: Mucous membranes are moist.  Eyes:     General: Lids are normal. Vision grossly intact. Gaze aligned appropriately.     Extraocular Movements: Extraocular movements intact.     Conjunctiva/sclera: Conjunctivae normal.  Pulmonary:     Effort: Pulmonary effort is normal.  Musculoskeletal:        General: Normal range of motion.     Cervical back: Neck supple.     Comments: Right foot: Localized firm swelling to the dorsal aspect of the mid right foot without skin breakdown or evidence of ulceration/drainage.  Normal range of motion to the right foot with normal movement of all 5 toes to the right foot.  Capillary refill is less than 3, sensation intact, +2 anterior tibialis and dorsalis pedis pulses bilaterally.  No diffuse soft tissue swelling.  Skin:    General: Skin is warm and dry.     Capillary Refill: Capillary refill takes less than 2 seconds.     Findings: No rash.  Neurological:     General: No focal deficit present.     Mental Status: He is alert and oriented to person, place, and time. Mental status is at baseline.     Cranial  Nerves: No dysarthria or facial asymmetry.  Psychiatric:        Mood and Affect: Mood normal.        Speech: Speech normal.        Behavior: Behavior normal.        Thought Content: Thought content normal.        Judgment: Judgment normal.           UC Treatments / Results  Labs (all labs ordered are listed, but only abnormal results are displayed) Labs Reviewed - No data to display  EKG   Radiology DG Foot Complete Right  Result Date: 06/27/2022 CLINICAL DATA:  Foot injury EXAM: RIGHT FOOT COMPLETE - 3+ VIEW COMPARISON:  10/16/2021 FINDINGS: Plate and screw fixator traversing the first and second metatarsals and medial and middle cuneiforms. The vertically oriented screw extending into the middle cuneiform appears to have newly backed out by about 1.3 cm, and is tenting the skin. The other screws and the oblique by threaded screw appears satisfactorily positioned. Mild dorsal talonavicular spurring. No fracture identified. Dorsal subcutaneous edema along the forefoot. IMPRESSION: 1. Plate and screw fixator along the first and second metatarsal and adjacent medial and middle cuneiforms. a The vertically oriented screw extending into the middle cuneiform appears to have newly backed out by about 1.3 cm, and is tenting the dorsal skin of the foot. 2. Dorsal subcutaneous edema along the forefoot. 3. No fracture identified. 4. Mild dorsal talonavicular spurring. Electronically Signed   By: Gaylyn Rong M.D.   On: 06/27/2022 11:13    Procedures Procedures (including critical care time)  Medications Ordered in UC Medications - No data to display  Initial Impression / Assessment and Plan / UC Course  I have reviewed the triage vital signs and the nursing notes.  Pertinent labs & imaging results that were available during my care of the patient were reviewed by me and considered in my medical decision making (see chart for details).  1.  Injury of right foot X-ray of the right  foot shows vertically oriented screw backed out by about 1.3 cm causing tenting to the dorsal skin of the right foot.  Dr. Caryn Bee  Rogers consulted to discuss case and recommends follow-up with Dr. Lajoyce Corners outpatient for further evaluation and possible removal/replacement of the affected screw.  Recommends postoperative shoe placement to avoid irritation of the skin.  Advised patient to follow-up with urgent care prior to appointment with Dr. Lajoyce Corners if he experiences any skin breakdown or drainage from the swollen area.  Advised to take Tylenol and ibuprofen as needed for pain and discomfort.  He is ambulatory with a steady gait and and without neurovascular compromise.  Patient is upset to hear this news and is visibly irritable.  Agrees to follow-up with Dr. Lajoyce Corners outpatient in office.  Discussed physical exam and available lab work findings in clinic with patient.  Counseled patient regarding appropriate use of medications and potential side effects for all medications recommended or prescribed today. Discussed red flag signs and symptoms of worsening condition,when to call the PCP office, return to urgent care, and when to seek higher level of care in the emergency department. Patient verbalizes understanding and agreement with plan. All questions answered. Patient discharged in stable condition.   Final Clinical Impressions(s) / UC Diagnoses   Final diagnoses:  Injury of right foot, initial encounter     Discharge Instructions      Call Dr. Audrie Lia office to schedule a follow-up appointment outpatient to have the screw repaired.  We have placed you in a postop shoe today to help avoid pressure on the screw.  You may take Tylenol or ibuprofen as needed for any pain you may experience.  If you experience any skin breakdown or wound to the swollen area, please follow-up with urgent care for reevaluation.  Call Dr. Lajoyce Corners on Monday to schedule an appointment for soon as possible.    ED Prescriptions    None    PDMP not reviewed this encounter.   Carlisle Beers, [place] 06/27/22 1141

## 2022-06-29 ENCOUNTER — Encounter: Payer: Self-pay | Admitting: Orthopedic Surgery

## 2022-06-29 ENCOUNTER — Ambulatory Visit (INDEPENDENT_AMBULATORY_CARE_PROVIDER_SITE_OTHER): Payer: Self-pay | Admitting: Orthopedic Surgery

## 2022-06-29 ENCOUNTER — Telehealth: Payer: Self-pay | Admitting: Orthopedic Surgery

## 2022-06-29 DIAGNOSIS — T85848A Pain due to other internal prosthetic devices, implants and grafts, initial encounter: Secondary | ICD-10-CM

## 2022-06-29 NOTE — Progress Notes (Signed)
Office Visit Note   Patient: Chase Greer           Date of Birth: 1991/07/26           MRN: [id number] Visit Date: 06/29/2022              Requested by: No referring provider defined for this encounter. PCP: Patient, No Pcp Per  Chief Complaint  Patient presents with   Right Foot - Follow-up    status post right midfoot fusion  Loose hardware      HPI: Patient is a 31 year old gentleman who is 1 year status post open reduction internal fixation Lisfranc fracture dislocation right foot.  Patient has noticed acute prominent hardware which is painful.  Assessment & Plan: Visit Diagnoses:  1. Pain from implanted hardware, initial encounter     Plan: The screw was removed applications.  Start dry dressing change Dial soap cleansing follow-up as needed  Follow-Up Instructions: Return if symptoms worsen or fail to improve.   Ortho Exam  Patient is alert, oriented, no adenopathy, well-dressed, normal affect, normal respiratory effort. Examination there is no redness or cellulitis or fluctuance of the right foot.  The hardware is stable.  Has 1 screw that has backed out.  Radiograph shows one of the proximal locking screws has backed out.  After informed consent the area was locally prepped with Betadine and locally anesthetized with 1 cc of 1% lidocaine plain a small stab incision was made and the screw was removed without complications.  A sterile dressing was applied.  Imaging: No results found. No images are attached to the encounter.  Labs: No results found for: "HGBA1C", "ESRSEDRATE", "CRP", "LABURIC", "REPTSTATUS", "GRAMSTAIN", "CULT", "LABORGA"   No results found for: "ALBUMIN", "PREALBUMIN", "CBC"  No results found for: "MG" No results found for: "VD25OH"  No results found for: "PREALBUMIN"    Latest Ref Rng & Units 03/02/2013   12:55 PM 03/02/2013   12:48 PM 09/05/2008   12:14 PM  CBC EXTENDED  WBC 4.0 - 10.5 K/uL  7.3    RBC 4.22 - 5.81 MIL/uL  5.25     Hemoglobin 13.0 - 17.0 g/dL 36.6  44.0  34.7   HCT 39.0 - 52.0 % 49.0  43.2  57.0   Platelets 150 - 400 K/uL  PLATELET CLUMPS NOTED ON SMEAR, UNABLE TO ESTIMATE    NEUT# 1.7 - 7.7 K/uL  5.0    Lymph# 0.7 - 4.0 K/uL  1.7       There is no height or weight on file to calculate BMI.  Orders:  No orders of the defined types were placed in this encounter.  No orders of the defined types were placed in this encounter.    Procedures: No procedures performed  Clinical Data: No additional findings.  ROS:  All other systems negative, except as noted in the HPI. Review of Systems  Objective: Vital Signs: There were no vitals taken for this visit.  Specialty Comments:  No specialty comments available.  PMFS History: Patient Active Problem List   Diagnosis Date Noted   Lisfranc dislocation, right, initial encounter    Left anterior cruciate ligament tear 07/02/2016   Acute medial meniscal injury of knee, left, initial encounter 07/02/2016   Complex tear of lateral meniscus of left knee as current injury 07/02/2016   Past Medical History:  Diagnosis Date   ACL tear    COVID 05/09/2020   Meniscus, lateral, derangement, left     Family History  Problem Relation Age of Onset   Hypertension Mother    Heart failure Mother    Hypertension Father     Past Surgical History:  Procedure Laterality Date   FINGER SURGERY Left    left ring finger   FOOT ARTHRODESIS Right 07/09/2021   Procedure: RIGHT MIDFOOT FUSION;  Surgeon: Nadara Mustard, MD;  Location: [facility] OR;  Service: Orthopedics;  Laterality: Right;   Social History   Occupational History   Not on file  Tobacco Use   Smoking status: Former   Smokeless tobacco: Never  Vaping Use   Vaping Use: Never used  Substance and Sexual Activity   Alcohol use: Not Currently    Comment: occ   Drug use: Not Currently   Sexual activity: Not on file

## 2022-06-29 NOTE — Telephone Encounter (Signed)
In the future if ever there is a patient with an urgent issue especially one that went to the ER you can send the call to triage. That way we can work the pt up and offer appt base on acuity. We made an appt for him this morning with Lajoyce Corners. Thanks

## 2022-06-29 NOTE — Telephone Encounter (Signed)
Pt called in stating that he went to the ED over the weekend.... Pt stated that the ED advised him that is screw was loose in his foot... Pt requested for an appt today... Advised pt no avail appt today... Advised Pt that Denny Peon have an earliest appt on Wed... Pt started to get Irate... Pt stated that he needed someone to call him back as soon as possible... Pt stated that if he have to come up to the office he will... Pt requesting callback

## 2023-02-05 ENCOUNTER — Encounter (HOSPITAL_COMMUNITY): Payer: Self-pay | Admitting: *Deleted

## 2023-02-05 ENCOUNTER — Ambulatory Visit (HOSPITAL_COMMUNITY)
Admission: EM | Admit: 2023-02-05 | Discharge: 2023-02-05 | Disposition: A | Discharge status: Home / Self Care | Payer: Self-pay | Attending: Emergency Medicine | Admitting: Emergency Medicine

## 2023-02-05 DIAGNOSIS — S39012A Strain of muscle, fascia and tendon of lower back, initial encounter: Secondary | ICD-10-CM

## 2023-02-05 DIAGNOSIS — S161XXA Strain of muscle, fascia and tendon at neck level, initial encounter: Secondary | ICD-10-CM

## 2023-02-05 MED ORDER — IBUPROFEN 800 MG PO TABS: 800.0000 mg | ORAL_TABLET | Freq: Three times a day (TID) | ORAL | 0 refills | Status: DC

## 2023-02-05 MED ORDER — KETOROLAC TROMETHAMINE 30 MG/ML IJ SOLN
INTRAMUSCULAR | Status: AC
  Filled 2023-02-05: qty 1

## 2023-02-05 MED ORDER — KETOROLAC TROMETHAMINE 30 MG/ML IJ SOLN
30.0000 mg | Freq: Once | INTRAMUSCULAR | Status: AC
  Administered 2023-02-05: 30 mg via INTRAMUSCULAR

## 2023-02-05 MED ORDER — METHOCARBAMOL 500 MG PO TABS: 500.0000 mg | ORAL_TABLET | Freq: Two times a day (BID) | ORAL | 0 refills | Status: DC

## 2023-02-05 NOTE — Discharge Instructions (Signed)
Your injuries appear to be musculoskeletal in nature. We have given you an injection of Toradol, a powerful anti-inflammatory.  Starting tomorrow you can take the 800 mg of ibuprofen up to 3 times daily as needed for pain.  You can take the muscle relaxer up to 2 times daily, do not drink or drive on this medication as it may make you drowsy.  In combination to the oral medications, I recommend gentle stretching, warm compresses, and Epsom salt baths.  If your pain persist beyond the next week, please follow-up with [place] [facility].  Please return to clinic for any new or urgent symptoms.
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
## 2023-05-02 ENCOUNTER — Encounter (HOSPITAL_COMMUNITY): Payer: Self-pay

## 2023-05-02 ENCOUNTER — Ambulatory Visit (HOSPITAL_COMMUNITY)
Admission: EM | Admit: 2023-05-02 | Discharge: 2023-05-02 | Disposition: A | Discharge status: Home / Self Care | Payer: BC Managed Care – PPO | Attending: Emergency Medicine | Admitting: Emergency Medicine

## 2023-05-02 DIAGNOSIS — Z113 Encounter for screening for infections with a predominantly sexual mode of transmission: Secondary | ICD-10-CM | POA: Insufficient documentation

## 2023-05-02 LAB — HIV ANTIBODY (ROUTINE TESTING W REFLEX): HIV Screen 4th Generation wRfx: NONREACTIVE

## 2023-05-02 NOTE — ED Triage Notes (Addendum)
Patient here today to be tested for all STDs.

## 2023-05-02 NOTE — ED Provider Notes (Signed)
[facility]    CSN: [id number] Arrival date & time: 05/02/23  1353      History   Chief Complaint Chief Complaint  Patient presents with   SEXUALLY TRANSMITTED DISEASE    HPI Chase Greer is a 32 y.o. male.   Patient presents for STD screening.  Patient denies any symptoms or known exposures.     Past Medical History:  Diagnosis Date   ACL tear    COVID 05/09/2020   Meniscus, lateral, derangement, left     Patient Active Problem List   Diagnosis Date Noted   Lisfranc dislocation, right, initial encounter    Left anterior cruciate ligament tear 07/02/2016   Acute medial meniscal injury of knee, left, initial encounter 07/02/2016   Complex tear of lateral meniscus of left knee as current injury 07/02/2016    Past Surgical History:  Procedure Laterality Date   FINGER SURGERY Left    left ring finger   FOOT ARTHRODESIS Right 07/09/2021   Procedure: RIGHT MIDFOOT FUSION;  Surgeon: Nadara Mustard, MD;  Location: [facility] OR;  Service: Orthopedics;  Laterality: Right;       Home Medications    Prior to Admission medications   Not on File    Family History Family History  Problem Relation Age of Onset   Hypertension Mother    Heart failure Mother    Hypertension Father     Social History Social History   Tobacco Use   Smoking status: Former   Smokeless tobacco: Never  Advertising account planner   Vaping status: Never Used  Substance Use Topics   Alcohol use: Not Currently    Comment: occ   Drug use: Not Currently     Allergies   Patient has no known allergies.   Review of Systems Review of Systems  Constitutional:  Negative for chills, fatigue and fever.  Gastrointestinal:  Negative for abdominal pain, diarrhea, nausea and vomiting.  Genitourinary:  Negative for difficulty urinating, dysuria, flank pain, genital sores, hematuria, penile discharge, penile pain, penile swelling, scrotal swelling, testicular pain and urgency.     Physical  Exam Triage Vital Signs ED Triage Vitals  Encounter Vitals Group     BP 05/02/23 1513 105/68     Systolic BP Percentile --      Diastolic BP Percentile --      Pulse Rate 05/02/23 1513 60     Resp 05/02/23 1513 16     Temp 05/02/23 1513 98.8 F (37.1 C)     Temp Source 05/02/23 1513 Oral     SpO2 05/02/23 1513 96 %     Weight 05/02/23 1513 170 lb (77.1 kg)     Height 05/02/23 1513 5\' 9"  (1.753 m)     Head Circumference --      Peak Flow --      Pain Score 05/02/23 1512 0     Pain Loc --      Pain Education --      Exclude from Growth Chart --    No data found.  Updated Vital Signs BP 105/68 (BP Location: Right Arm)   Pulse 60   Temp 98.8 F (37.1 C) (Oral)   Resp 16   Ht 5\' 9"  (1.753 m)   Wt 170 lb (77.1 kg)   SpO2 96%   BMI 25.10 kg/m   Visual Acuity Right Eye Distance:   Left Eye Distance:   Bilateral Distance:    Right Eye Near:   Left Eye Near:  Bilateral Near:     Physical Exam Vitals and nursing note reviewed.  Constitutional:      General: He is awake. He is not in acute distress.    Appearance: Normal appearance. He is well-developed and well-groomed. He is not ill-appearing, toxic-appearing or diaphoretic.  Neurological:     Mental Status: He is alert.  Psychiatric:        Behavior: Behavior is cooperative.      UC Treatments / Results  Labs (all labs ordered are listed, but only abnormal results are displayed) Labs Reviewed  RPR  HIV ANTIBODY (ROUTINE TESTING W REFLEX)  CYTOLOGY, (ORAL, ANAL, URETHRAL) ANCILLARY ONLY    EKG   Radiology No results found.  Procedures Procedures (including critical care time)  Medications Ordered in UC Medications - No data to display  Initial Impression / Assessment and Plan / UC Course  I have reviewed the triage vital signs and the nursing notes.  Pertinent labs & imaging results that were available during my care of the patient were reviewed by me and considered in my medical decision  making (see chart for details).     Patient presented for STD screening. Patient denies any symptoms or known exposures. Patient declining physical assessment stating that he is in a rush and does not have time to wait.  Patient performed self swab.  Patient requested HIV and syphilis testing.  Discussed follow-up and return precautions. Final Clinical Impressions(s) / UC Diagnoses   Final diagnoses:  Screening for STD (sexually transmitted disease)     Discharge Instructions      Your results will return over the next few days and someone will call you with positive results and prescribe treatment as needed. Return here as needed.    ED Prescriptions   None    PDMP not reviewed this encounter.   Wynonia Lawman A, NP 05/02/23 [phone]

## 2023-05-02 NOTE — Discharge Instructions (Signed)
Your results will return over the next few days and someone will call you with positive results and prescribe treatment as needed. Return here as needed.

## 2023-05-03 LAB — CYTOLOGY, (ORAL, ANAL, URETHRAL) ANCILLARY ONLY
Chlamydia: NEGATIVE
Comment: NEGATIVE
Comment: NEGATIVE
Comment: NORMAL
Neisseria Gonorrhea: NEGATIVE
Trichomonas: NEGATIVE

## 2023-05-03 LAB — RPR: RPR Ser Ql: NONREACTIVE

## 2023-05-19 ENCOUNTER — Ambulatory Visit (HOSPITAL_COMMUNITY)
Admission: EM | Admit: 2023-05-19 | Discharge: 2023-05-19 | Disposition: A | Discharge status: Home / Self Care | Payer: BC Managed Care – PPO | Attending: Emergency Medicine | Admitting: Emergency Medicine

## 2023-05-19 ENCOUNTER — Encounter (HOSPITAL_COMMUNITY): Payer: Self-pay

## 2023-05-19 DIAGNOSIS — M79601 Pain in right arm: Secondary | ICD-10-CM | POA: Diagnosis not present

## 2023-05-19 MED ORDER — IBUPROFEN 800 MG PO TABS: 800.0000 mg | ORAL_TABLET | Freq: Three times a day (TID) | ORAL | 0 refills | Status: DC

## 2023-05-19 MED ORDER — IBUPROFEN 800 MG PO TABS
ORAL_TABLET | ORAL | Status: AC
  Filled 2023-05-19: qty 1

## 2023-05-19 MED ORDER — IBUPROFEN 800 MG PO TABS
800.0000 mg | ORAL_TABLET | Freq: Once | ORAL | Status: AC
  Administered 2023-05-19: 800 mg via ORAL

## 2023-05-19 NOTE — Discharge Instructions (Addendum)
Take the anti-inflammatory medications 3 times daily with food.  You can heat or ice your arm.   Seek immediate care at the nearest Emergency Department if you develop swelling in your right arm, shortness of breath, chest pain, or any new concerning symptoms.  It is important that you follow-up with a primary care provider for further evaluation if this persists, as they can order outpatient imaging like an ultrasound if needed.

## 2023-05-19 NOTE — ED Provider Notes (Signed)
[facility]    CSN: [id number] Arrival date & time: 05/19/23  1313      History   Chief Complaint Chief Complaint  Patient presents with   Arm Pain    HPI Chase Greer is a 32 y.o. male.   Patient presents to clinic for right forearm 'vein' pain that has been present for the past few days.  Feels like his vein is harder than usual. 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  Denies any recent falls or injuries.  Denies swelling.  Has not tried any interventions for the pain.  Reports pain in his right forearm along posterior forearm vein.  Denies any shortness of breath.  Denies any trauma to that area, no rashes, bug bites or breaks in the skin.  Denies injection drug use or getting blood drawn from this vein.   The history is provided by the patient and medical records.  Arm Pain Pertinent negatives include no chest pain and no shortness of breath.    Past Medical History:  Diagnosis Date   ACL tear    COVID 05/09/2020   Meniscus, lateral, derangement, left     Patient Active Problem List   Diagnosis Date Noted   Lisfranc dislocation, right, initial encounter    Left anterior cruciate ligament tear 07/02/2016   Acute medial meniscal injury of knee, left, initial encounter 07/02/2016   Complex tear of lateral meniscus of left knee as current injury 07/02/2016    Past Surgical History:  Procedure Laterality Date   FINGER SURGERY Left    left ring finger   FOOT ARTHRODESIS Right 07/09/2021   Procedure: RIGHT MIDFOOT FUSION;  Surgeon: Nadara Mustard, MD;  Location: [facility] OR;  Service: Orthopedics;  Laterality: Right;       Home Medications    Prior to Admission medications   Medication Sig Start Date End Date Taking? Authorizing Provider  ibuprofen (ADVIL) 800 MG tablet Take 1 tablet (800 mg total) by mouth 3 (three) times daily. 05/19/23  Yes Jhovany Weidinger, [place], FNP    Family  History Family History  Problem Relation Age of Onset   Hypertension Mother    Heart failure Mother    Hypertension Father     Social History Social History   Tobacco Use   Smoking status: Former   Smokeless tobacco: Never  Vaping Use   Vaping status: Never Used  Substance Use Topics   Alcohol use: Not Currently    Comment: occ   Drug use: Not Currently     Allergies   Patient has no known allergies.   Review of Systems Review of Systems  Constitutional:  Negative for fever.  Respiratory:  Negative for cough and shortness of breath.   Cardiovascular:  Negative for chest pain.     Physical Exam Triage Vital Signs ED Triage Vitals [05/19/23 1341]  Encounter Vitals Group     BP 113/70     Systolic BP Percentile      Diastolic BP Percentile      Pulse Rate (!) 56     Resp 16     Temp 98.5 F (36.9 C)     Temp Source Oral     SpO2 96 %     Weight 170 lb (77.1 kg)     Height 5\' 9"  (1.753 m)     Head Circumference      Peak Flow  Pain Score 7     Pain Loc      Pain Education      Exclude from Growth Chart    No data found.  Updated Vital Signs BP 113/70 (BP Location: Left Arm)   Pulse (!) 56   Temp 98.5 F (36.9 C) (Oral)   Resp 16   Ht 5\' 9"  (1.753 m)   Wt 170 lb (77.1 kg)   SpO2 96%   BMI 25.10 kg/m   Visual Acuity Right Eye Distance:   Left Eye Distance:   Bilateral Distance:    Right Eye Near:   Left Eye Near:    Bilateral Near:     Physical Exam Vitals and nursing note reviewed.  Constitutional:      Appearance: Normal appearance.  HENT:     Head: Normocephalic and atraumatic.     Right Ear: External ear normal.     Left Ear: External ear normal.     Nose: Nose normal.     Mouth/Throat:     Mouth: Mucous membranes are moist.  Eyes:     Conjunctiva/sclera: Conjunctivae normal.  Cardiovascular:     Rate and Rhythm: Normal rate and regular rhythm.     Heart sounds: Normal heart sounds. No murmur heard. Pulmonary:      Effort: Pulmonary effort is normal. No respiratory distress.     Breath sounds: Normal breath sounds.  Musculoskeletal:        General: No swelling, deformity or signs of injury. Normal range of motion.  Skin:    General: Skin is warm and dry.     Capillary Refill: Capillary refill takes less than 2 seconds.          Comments: Has hardening of vein to right posterior FA that is palpable. No IVDU. No recent injuries. Radial pulses 2+  Neurological:     General: No focal deficit present.     Mental Status: He is alert and oriented to person, place, and time.  Psychiatric:        Mood and Affect: Mood normal.        Behavior: Behavior normal. Behavior is cooperative.      UC Treatments / Results  Labs (all labs ordered are listed, but only abnormal results are displayed) Labs Reviewed - No data to display  EKG   Radiology No results found.  Procedures Procedures (including critical care time)  Medications Ordered in UC Medications  ibuprofen (ADVIL) tablet 800 mg (800 mg Oral Given 05/19/23 1409)    Initial Impression / Assessment and Plan / UC Course  I have reviewed the triage vital signs and the nursing notes.  Pertinent labs & imaging results that were available during my care of the patient were reviewed by me and considered in my medical decision making (see chart for details).  Vitals and triage reviewed, patient is hemodynamically stable.  There is some hardening of a superficial vein in his right forearm, suspect some type of phlebitis.  No IVDU or recent labs from this area.  Could be due to overuse.  No swelling in the distal wrist.  Radial pulses 2+.  Strength 5 out of 5 in upper extremities.  Advised anti-inflammatories and PCP follow-up for imaging if this continues with ultrasound.  Strict emergency precautions given.  Plan of care, follow-up care and return precautions discussed, no questions at this time.     Final Clinical Impressions(s) / UC Diagnoses    Final diagnoses:  Right arm pain  Discharge Instructions      Take the anti-inflammatory medications 3 times daily with food.  You can heat or ice your arm.   Seek immediate care at the nearest Emergency Department if you develop swelling in your right arm, shortness of breath, chest pain, or any new concerning symptoms.  It is important that you follow-up with a primary care provider for further evaluation if this persists, as they can order outpatient imaging like an ultrasound if needed.       ED Prescriptions     Medication Sig Dispense Auth. Provider   ibuprofen (ADVIL) 800 MG tablet Take 1 tablet (800 mg total) by mouth 3 (three) times daily. 21 tablet Khyran Riera, [place], [place]      PDMP not reviewed this encounter.   Tona Qualley, [place] N, [place] 05/19/23 [phone]

## 2023-05-19 NOTE — ED Triage Notes (Signed)
Patient here today with c/o right forearm pain X 2-3 days. No known injury.

## 2023-06-03 ENCOUNTER — Telehealth: Payer: Self-pay | Admitting: Emergency Medicine

## 2023-06-03 NOTE — Telephone Encounter (Signed)
Testing chart to show telephone encounter process

## 2023-08-19 ENCOUNTER — Ambulatory Visit (HOSPITAL_COMMUNITY)
Admission: EM | Admit: 2023-08-19 | Discharge: 2023-08-19 | Disposition: A | Discharge status: Home / Self Care | Payer: Self-pay | Attending: Emergency Medicine | Admitting: Emergency Medicine

## 2023-08-19 ENCOUNTER — Encounter (HOSPITAL_COMMUNITY): Payer: Self-pay

## 2023-08-19 DIAGNOSIS — Z113 Encounter for screening for infections with a predominantly sexual mode of transmission: Secondary | ICD-10-CM

## 2023-08-19 NOTE — ED Provider Notes (Signed)
 [facility]    CSN: [id number] Arrival date & time: 08/19/23  1651      History   Chief Complaint Chief Complaint  Patient presents with   SEXUALLY TRANSMITTED DISEASE    HPI Chase Greer is a 33 y.o. male.  Here for STD testing No known exposure. Not having symptoms  Negative HIV/RPR 4 months ago  Past Medical History:  Diagnosis Date   ACL tear    COVID 05/09/2020   Meniscus, lateral, derangement, left     Patient Active Problem List   Diagnosis Date Noted   Lisfranc dislocation, right, initial encounter    Left anterior cruciate ligament tear 07/02/2016   Acute medial meniscal injury of knee, left, initial encounter 07/02/2016   Complex tear of lateral meniscus of left knee as current injury 07/02/2016    Past Surgical History:  Procedure Laterality Date   FINGER SURGERY Left    left ring finger   FOOT ARTHRODESIS Right 07/09/2021   Procedure: RIGHT MIDFOOT FUSION;  Surgeon: Harden Jerona GAILS, MD;  Location: [facility] OR;  Service: Orthopedics;  Laterality: Right;       Home Medications    Prior to Admission medications   Medication Sig Start Date End Date Taking? Authorizing Provider  ibuprofen  (ADVIL ) 800 MG tablet Take 1 tablet (800 mg total) by mouth 3 (three) times daily. 05/19/23   Dreama Sullivan SAILOR, FNP    Family History Family History  Problem Relation Age of Onset   Hypertension Mother    Heart failure Mother    Hypertension Father     Social History Social History   Tobacco Use   Smoking status: Former   Smokeless tobacco: Never  Advertising Account Planner   Vaping status: Never Used  Substance Use Topics   Alcohol use: Not Currently    Comment: occ   Drug use: Not Currently     Allergies   Patient has no known allergies.   Review of Systems Review of Systems Per HPI  Physical Exam Triage Vital Signs ED Triage Vitals  Encounter Vitals Group     BP 08/19/23 1837 117/63     Systolic BP Percentile --      Diastolic BP  Percentile --      Pulse Rate 08/19/23 1837 (!) 52     Resp 08/19/23 1837 18     Temp 08/19/23 1837 98.3 F (36.8 C)     Temp Source 08/19/23 1837 Oral     SpO2 08/19/23 1837 97 %     Weight --      Height --      Head Circumference --      Peak Flow --      Pain Score 08/19/23 1838 0     Pain Loc --      Pain Education --      Exclude from Growth Chart --    No data found.  Updated Vital Signs BP 117/63 (BP Location: Left Arm)   Pulse (!) 52   Temp 98.3 F (36.8 C) (Oral)   Resp 18   SpO2 97%   Visual Acuity Right Eye Distance:   Left Eye Distance:   Bilateral Distance:    Right Eye Near:   Left Eye Near:    Bilateral Near:     Physical Exam Vitals and nursing note reviewed.  Constitutional:      General: He is not in acute distress.    Appearance: Normal appearance.  Cardiovascular:  Rate and Rhythm: Normal rate and regular rhythm.     Heart sounds: Normal heart sounds.  Pulmonary:     Effort: Pulmonary effort is normal.     Breath sounds: Normal breath sounds.  Neurological:     Mental Status: He is alert and oriented to person, place, and time.     UC Treatments / Results  Labs (all labs ordered are listed, but only abnormal results are displayed) Labs Reviewed  CYTOLOGY, (ORAL, ANAL, URETHRAL) ANCILLARY ONLY    EKG   Radiology No results found.  Procedures Procedures (including critical care time)  Medications Ordered in UC Medications - No data to display  Initial Impression / Assessment and Plan / UC Course  I have reviewed the triage vital signs and the nursing notes.  Pertinent labs & imaging results that were available during my care of the patient were reviewed by me and considered in my medical decision making (see chart for details).  Cytology swab pending Safe sex practices Can return if needed  Final Clinical Impressions(s) / UC Diagnoses   Final diagnoses:  Screen for STD (sexually transmitted disease)      Discharge Instructions      We will call you if anything on your swab returns positive. You can also see these results on MyChart. Please abstain from sexual intercourse until your results return.     ED Prescriptions   None    PDMP not reviewed this encounter.   Jeryl Asberry RIGGERS 08/19/23 8143

## 2023-08-19 NOTE — ED Triage Notes (Signed)
 Pt requesting STD testing. Denies sx's. States don't trust the girl he was with.

## 2023-08-19 NOTE — Discharge Instructions (Addendum)
 We will call you if anything on your swab returns positive. You can also see these results on MyChart. Please abstain from sexual intercourse until your results return.

## 2023-08-20 LAB — CYTOLOGY, (ORAL, ANAL, URETHRAL) ANCILLARY ONLY
Chlamydia: NEGATIVE
Comment: NEGATIVE
Comment: NEGATIVE
Comment: NORMAL
Neisseria Gonorrhea: NEGATIVE
Trichomonas: POSITIVE — AB

## 2023-08-21 ENCOUNTER — Telehealth: Payer: Self-pay

## 2023-08-21 MED ORDER — METRONIDAZOLE 500 MG PO TABS: 2000.0000 mg | ORAL_TABLET | Freq: Once | ORAL | 0 refills | Status: AC

## 2023-08-21 NOTE — Telephone Encounter (Signed)
 Per protocol, pt requires tx with metronidazole. Rx sent to pharmacy on file.

## 2023-08-22 ENCOUNTER — Telehealth (HOSPITAL_COMMUNITY): Payer: Self-pay | Admitting: Emergency Medicine

## 2023-08-22 MED ORDER — METRONIDAZOLE 500 MG PO TABS: 2000.0000 mg | ORAL_TABLET | Freq: Once | ORAL | 0 refills | Status: AC

## 2023-08-22 MED ORDER — METRONIDAZOLE 500 MG PO TABS: 2000.0000 mg | ORAL_TABLET | Freq: Once | ORAL | 0 refills | Status: DC

## 2023-08-22 NOTE — Telephone Encounter (Signed)
 Positive for trichomoniasis, will treat with 2 g of Flagyl once.  Medication sent into pharmacy.  Patient notified via MyChart by staff.

## 2023-08-22 NOTE — Addendum Note (Signed)
 Addended by: Warren Danes on: 08/22/2023 01:16 PM   Modules accepted: Orders

## 2023-08-22 NOTE — Telephone Encounter (Signed)
 Pharmacy changed per pt request

## 2023-12-27 ENCOUNTER — Encounter (HOSPITAL_COMMUNITY): Payer: Self-pay

## 2023-12-27 ENCOUNTER — Ambulatory Visit (INDEPENDENT_AMBULATORY_CARE_PROVIDER_SITE_OTHER): Payer: Self-pay

## 2023-12-27 ENCOUNTER — Ambulatory Visit (HOSPITAL_COMMUNITY)
Admission: RE | Admit: 2023-12-27 | Discharge: 2023-12-27 | Disposition: A | Discharge status: Home / Self Care | Payer: Self-pay | Source: Ambulatory Visit | Attending: Physician Assistant | Admitting: Physician Assistant

## 2023-12-27 VITALS — BP 117/76 | HR 63 | Temp 98.1°F | Resp 14

## 2023-12-27 DIAGNOSIS — S40012A Contusion of left shoulder, initial encounter: Secondary | ICD-10-CM

## 2023-12-27 DIAGNOSIS — M25512 Pain in left shoulder: Secondary | ICD-10-CM

## 2023-12-27 DIAGNOSIS — M898X1 Other specified disorders of bone, shoulder: Secondary | ICD-10-CM

## 2023-12-27 MED ORDER — IBUPROFEN 800 MG PO TABS: 800.0000 mg | ORAL_TABLET | Freq: Three times a day (TID) | ORAL | 0 refills | Status: DC

## 2023-12-27 NOTE — Discharge Instructions (Addendum)
 Your x-rays were normal with no evidence of a broken bone or anything that is out of place.  I think that the injury is to the soft tissue (muscles and connective tissue).  Use ice for 15 minutes at a time 3-4 times per day.  Avoid any strenuous activity.  I have provided a work excuse note for the next several days.  Take ibuprofen  for pain and inflammation.  Do not take additional NSAIDs with this medication including aspirin, ibuprofen /Advil , naproxen /Aleve .  Use Tylenol /acetaminophen  as needed.  Follow-up with orthopedics; call to schedule an appointment.  If anything worsens please return for reevaluation.

## 2023-12-27 NOTE — ED Provider Notes (Signed)
 [facility]    CSN: [id number] Arrival date & time: 12/27/23  1321      History   Chief Complaint Chief Complaint  Patient presents with   appt 1230    HPI Chase Greer is a 33 y.o. male.   Patient presents today with a weeklong history of left shoulder/clavicular pain.  He reports that he bumped his shoulder against a wall and has had ongoing pain since that time.  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  Pain is rated 8/9 on a 0-10 pain scale, described as sharp, worse with certain movements, no relieving factors identified.  He has not been taking any over-the-counter medications for symptom management.  He does report injuring his left shoulder many years ago but has never had surgery on this.  Denies any numbness or paresthesias in his hand.  He is right-handed.  In addition, patient is requesting STI testing.  He is currently asymptomatic and denies any genital lesions or dysuria.  He was seen by our clinic in January 2025 at which point he tested positive for trichomonas.  Reports completing course of medication.  He denies any specific concern but just wants to be retested to make sure that the infection is cleared.    Past Medical History:  Diagnosis Date   ACL tear    COVID 05/09/2020   Meniscus, lateral, derangement, left     Patient Active Problem List   Diagnosis Date Noted   Lisfranc dislocation, right, initial encounter    Left anterior cruciate ligament tear 07/02/2016   Acute medial meniscal injury of knee, left, initial encounter 07/02/2016   Complex tear of lateral meniscus of left knee as current injury 07/02/2016    Past Surgical History:  Procedure Laterality Date   FINGER SURGERY Left    left ring finger   FOOT ARTHRODESIS Right 07/09/2021   Procedure: RIGHT MIDFOOT FUSION;  Surgeon: Timothy Ford, MD;  Location: [facility] OR;  Service: Orthopedics;  Laterality: Right;       Home Medications    Prior to Admission medications   Medication Sig Start Date End Date Taking? Authorizing Provider  ibuprofen  (ADVIL ) 800 MG tablet Take 1 tablet (800 mg total) by mouth 3 (three) times daily. 12/27/23   Juliany Daughety, Betsey Brow, PA-C    Family History Family History  Problem Relation Age of Onset   Hypertension Mother    Heart failure Mother    Hypertension Father     Social History Social History   Tobacco Use   Smoking status: Former   Smokeless tobacco: Never  Advertising account planner   Vaping status: Never Used  Substance Use Topics   Alcohol use: Not Currently    Comment: occ   Drug use: Not Currently     Allergies   Patient has no known allergies.   Review of Systems Review of Systems  Constitutional:  Positive for activity change. Negative for appetite change, fatigue and fever.  Gastrointestinal:  Negative for abdominal pain, diarrhea, nausea and vomiting.  Genitourinary:  Negative for dysuria, frequency, genital sores, penile discharge, penile pain and urgency.  Musculoskeletal:  Positive for arthralgias. Negative for gait problem, joint swelling and myalgias.  Skin:  Negative for color change and wound.  Neurological:  Negative for weakness and numbness.     Physical Exam Triage Vital Signs ED  Triage Vitals [12/27/23 1334]  Encounter Vitals Group     BP 117/76     Systolic BP Percentile      Diastolic BP Percentile      Pulse Rate 63     Resp 14     Temp 98.1 F (36.7 C)     Temp Source Oral     SpO2 97 %     Weight      Height      Head Circumference      Peak Flow      Pain Score 8     Pain Loc      Pain Education      Exclude from Growth Chart    No data found.  Updated Vital Signs BP 117/76   Pulse 63   Temp 98.1 F (36.7 C) (Oral)   Resp 14   SpO2 97%   Visual Acuity Right Eye Distance:   Left Eye Distance:   Bilateral Distance:    Right Eye Near:    Left Eye Near:    Bilateral Near:     Physical Exam Vitals reviewed.  Constitutional:      General: He is awake.     Appearance: Normal appearance. He is well-developed. He is not ill-appearing.     Comments: Very pleasant male appears stated age in no acute distress sitting comfortably in exam room  HENT:     Head: Normocephalic and atraumatic.  Cardiovascular:     Rate and Rhythm: Normal rate and regular rhythm.     Heart sounds: Normal heart sounds, S1 normal and S2 normal. No murmur heard.    Comments: Capillary refill within 2 seconds left fingers Pulmonary:     Effort: Pulmonary effort is normal.     Breath sounds: Normal breath sounds. No stridor. No wheezing, rhonchi or rales.     Comments: Clear to auscultation bilaterally Abdominal:     Palpations: Abdomen is soft.     Tenderness: There is no abdominal tenderness.  Genitourinary:    Comments: Exam deferred Musculoskeletal:     Left shoulder: Tenderness and bony tenderness present. No swelling or deformity. Decreased range of motion. Normal strength.     Comments: Left shoulder: Tenderness palpation over AC joint and along distal clavicle.  No deformity noted.  Normal active range of motion.  Negative drop arm, Apley scratch, empty can.  Hand is neurovascularly intact.  Decreased range of motion with abduction secondary to discomfort.  Neurological:     Mental Status: He is alert.  Psychiatric:        Behavior: Behavior is cooperative.      UC Treatments / Results  Labs (all labs ordered are listed, but only abnormal results are displayed) Labs Reviewed  CYTOLOGY, (ORAL, ANAL, URETHRAL) ANCILLARY ONLY    EKG   Radiology DG Shoulder Left Result Date: 12/27/2023 CLINICAL DATA:  Pain after trauma several days ago. EXAM: LEFT SHOULDER - 3 VIEW; LEFT CLAVICLE - 2 VIEWS COMPARISON:  None Available. FINDINGS: No fracture or dislocation. Preserved bone mineralization. Preserved AC joint and glenohumeral joint.  IMPRESSION: No acute osseous abnormality. Electronically Signed   By: Adrianna Horde M.D.   On: 12/27/2023 14:57   DG Clavicle Left Result Date: 12/27/2023 CLINICAL DATA:  Pain after trauma several days ago. EXAM: LEFT SHOULDER - 3 VIEW; LEFT CLAVICLE - 2 VIEWS COMPARISON:  None Available. FINDINGS: No fracture or dislocation. Preserved bone mineralization. Preserved AC joint and glenohumeral joint. IMPRESSION: No acute osseous  abnormality. Electronically Signed   By: Adrianna Horde M.D.   On: 12/27/2023 14:57    Procedures Procedures (including critical care time)  Medications Ordered in UC Medications - No data to display  Initial Impression / Assessment and Plan / UC Course  I have reviewed the triage vital signs and the nursing notes.  Pertinent labs & imaging results that were available during my care of the patient were reviewed by me and considered in my medical decision making (see chart for details).     Patient is well-appearing, afebrile, nontoxic, nontachycardic.  X-ray of clavicle and shoulder were obtained that showed no acute osseous abnormality.  Suspect contusion as etiology of symptoms.  He was encouraged to use ice 15 minutes at a time 3-4 times a day and start ibuprofen  for pain relief.  We discussed that he is not to take NSAIDs with this medication including aspirin, ibuprofen /Advil , naproxen /Aleve .  He can use Tylenol  for breakthrough pain.  He was given several days off work to allow for healing but recommended that he follow-up with orthopedics if his symptoms or not improving given that this is interfering with his ability to perform daily activities including work duties.  Discussed that if anything worsens he should be seen immediately.  Strict return precautions given.  Excuse note provided.  STI swab was collected.  He is currently asymptomatic and so we will contact him if anything is positive and we need to arrange treatment.  Final Clinical Impressions(s) / UC  Diagnoses   Final diagnoses:  Acute pain of left shoulder  Contusion of left shoulder, initial encounter  Pain of left clavicle     Discharge Instructions      Your x-rays were normal with no evidence of a broken bone or anything that is out of place.  I think that the injury is to the soft tissue (muscles and connective tissue).  Use ice for 15 minutes at a time 3-4 times per day.  Avoid any strenuous activity.  I have provided a work excuse note for the next several days.  Take ibuprofen  for pain and inflammation.  Do not take additional NSAIDs with this medication including aspirin, ibuprofen /Advil , naproxen /Aleve .  Use Tylenol /acetaminophen  as needed.  Follow-up with orthopedics; call to schedule an appointment.  If anything worsens please return for reevaluation.   ED Prescriptions     Medication Sig Dispense Auth. Provider   ibuprofen  (ADVIL ) 800 MG tablet Take 1 tablet (800 mg total) by mouth 3 (three) times daily. 21 tablet Yehia Mcbain K, PA-C      PDMP not reviewed this encounter.   Budd Cargo, PA-C 12/27/23 1509

## 2023-12-27 NOTE — ED Notes (Signed)
 Patient also requesting STD testing "to make sure I'm all good from last time I had sex infection".
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
## 2023-12-29 LAB — CYTOLOGY, (ORAL, ANAL, URETHRAL) ANCILLARY ONLY
Chlamydia: NEGATIVE
Comment: NEGATIVE
Comment: NEGATIVE
Comment: NORMAL
Neisseria Gonorrhea: NEGATIVE
Trichomonas: NEGATIVE

## 2024-02-19 ENCOUNTER — Encounter (HOSPITAL_COMMUNITY): Payer: Self-pay | Admitting: *Deleted

## 2024-02-19 ENCOUNTER — Ambulatory Visit (HOSPITAL_COMMUNITY)
Admission: EM | Admit: 2024-02-19 | Discharge: 2024-02-19 | Disposition: A | Discharge status: Home / Self Care | Payer: Self-pay | Attending: Emergency Medicine | Admitting: Emergency Medicine

## 2024-02-19 DIAGNOSIS — S29011A Strain of muscle and tendon of front wall of thorax, initial encounter: Secondary | ICD-10-CM

## 2024-02-19 DIAGNOSIS — M25512 Pain in left shoulder: Secondary | ICD-10-CM

## 2024-02-19 MED ORDER — KETOROLAC TROMETHAMINE 30 MG/ML IJ SOLN
INTRAMUSCULAR | Status: AC
  Filled 2024-02-19: qty 1

## 2024-02-19 MED ORDER — IBUPROFEN 800 MG PO TABS: 800.0000 mg | ORAL_TABLET | Freq: Three times a day (TID) | ORAL | 0 refills | Status: DC

## 2024-02-19 MED ORDER — KETOROLAC TROMETHAMINE 30 MG/ML IJ SOLN
30.0000 mg | Freq: Once | INTRAMUSCULAR | Status: AC
  Administered 2024-02-19: 30 mg via INTRAMUSCULAR
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
## 2024-02-19 NOTE — Discharge Instructions (Addendum)
 I have refilled the 800 mg ibuprofen  that you can take 3 times daily as needed for pain. You received an injection of Toradol  in clinic today.  Please avoid taking any additional ibuprofen  for at least 8 hours after receiving this. Otherwise you can take 650 mg of Tylenol  every 6-8 hours as needed for breakthrough pain. Alternate between ice and heat as needed for pain. Follow-up with [facility] if your pain continues for further evaluation or management. Otherwise follow-up with your primary care provider or return here as needed.

## 2024-02-20 ENCOUNTER — Ambulatory Visit (HOSPITAL_COMMUNITY): Payer: Self-pay

## 2024-03-24 ENCOUNTER — Ambulatory Visit (HOSPITAL_COMMUNITY)
Admission: EM | Admit: 2024-03-24 | Discharge: 2024-03-24 | Disposition: A | Discharge status: Home / Self Care | Payer: Self-pay

## 2024-03-24 ENCOUNTER — Other Ambulatory Visit: Payer: Self-pay

## 2024-03-24 ENCOUNTER — Emergency Department (HOSPITAL_COMMUNITY)
Admission: EM | Admit: 2024-03-24 | Discharge: 2024-03-24 | Disposition: A | Discharge status: Home / Self Care | Payer: Self-pay | Source: Ambulatory Visit

## 2024-03-24 ENCOUNTER — Encounter (HOSPITAL_COMMUNITY): Payer: Self-pay

## 2024-03-24 DIAGNOSIS — W228XXA Striking against or struck by other objects, initial encounter: Secondary | ICD-10-CM | POA: Insufficient documentation

## 2024-03-24 DIAGNOSIS — Z23 Encounter for immunization: Secondary | ICD-10-CM | POA: Insufficient documentation

## 2024-03-24 DIAGNOSIS — S0181XA Laceration without foreign body of other part of head, initial encounter: Secondary | ICD-10-CM

## 2024-03-24 DIAGNOSIS — S0121XA Laceration without foreign body of nose, initial encounter: Secondary | ICD-10-CM | POA: Insufficient documentation

## 2024-03-24 MED ORDER — LIDOCAINE HCL (PF) 1 % IJ SOLN
2.0000 mL | Freq: Once | INTRAMUSCULAR | Status: AC
  Administered 2024-03-24: 2 mL
  Filled 2024-03-24: qty 30

## 2024-03-24 MED ORDER — TETANUS-DIPHTH-ACELL PERTUSSIS 5-2.5-18.5 LF-MCG/0.5 IM SUSY
0.5000 mL | PREFILLED_SYRINGE | Freq: Once | INTRAMUSCULAR | Status: AC
  Administered 2024-03-24: 0.5 mL via INTRAMUSCULAR
  Filled 2024-03-24: qty 0.5
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
## 2024-03-24 NOTE — Discharge Instructions (Signed)
 You have absorbable sutures, they will dissolve and come out on their own, however you can return in 7 to 10 days to have them removed.  Please follow-up with your primary doctor.  Return if develop severe headache, vision loss, uncontrolled nausea vomiting, seizures, wound becomes red, hot and starts draining pus or you develop any new or worsening symptoms that are concerning to you
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
## 2024-06-22 ENCOUNTER — Encounter (HOSPITAL_COMMUNITY): Payer: Self-pay

## 2024-06-22 ENCOUNTER — Ambulatory Visit (HOSPITAL_COMMUNITY)
Admission: EM | Admit: 2024-06-22 | Discharge: 2024-06-22 | Disposition: A | Discharge status: Home / Self Care | Payer: Self-pay

## 2024-06-22 ENCOUNTER — Other Ambulatory Visit: Payer: Self-pay | Admitting: Orthopedic Surgery

## 2024-06-22 ENCOUNTER — Ambulatory Visit (INDEPENDENT_AMBULATORY_CARE_PROVIDER_SITE_OTHER): Payer: Self-pay

## 2024-06-22 DIAGNOSIS — S62366A Nondisplaced fracture of neck of fifth metacarpal bone, right hand, initial encounter for closed fracture: Secondary | ICD-10-CM

## 2024-06-22 MED ORDER — KETOROLAC TROMETHAMINE 60 MG/2ML IM SOLN
INTRAMUSCULAR | Status: AC
  Filled 2024-06-22: qty 2

## 2024-06-22 MED ORDER — KETOROLAC TROMETHAMINE 60 MG/2ML IM SOLN
60.0000 mg | Freq: Once | INTRAMUSCULAR | Status: AC
  Administered 2024-06-22: 60 mg via INTRAMUSCULAR
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
## 2024-06-22 NOTE — Progress Notes (Signed)
 Orthopedic Tech Progress Note Patient Details:  Chase Greer 07-Jul-1991 [id number]  Ortho Devices Type of Ortho Device: Ulna gutter splint Ortho Device/Splint Location: Right Hand Ortho Device/Splint Interventions: Ordered, Application, Adjustment   Post Interventions Patient Tolerated: Fair Instructions Provided: Adjustment of device, Care of device   Chase Greer 06/22/2024, 10:31 AM
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
## 2024-06-23 ENCOUNTER — Other Ambulatory Visit: Payer: Self-pay

## 2024-06-23 ENCOUNTER — Encounter (HOSPITAL_COMMUNITY): Payer: Self-pay | Admitting: Orthopedic Surgery

## 2024-06-23 NOTE — Progress Notes (Signed)
 PCP - none Cardiologist - none  Chest x-ray - n/a EKG - n/a Stress Test - n/a ECHO - n/a Cardiac Cath - n/a  ICD Pacemaker/Loop - n/a  Sleep Study -  n/a  Diabetes - n/a  Aspirin & Blood Thinner Instructions:  n/a  ERAS - clear liquids til 9:30 AM DOS.  Anesthesia review: no  STOP now taking any Aspirin (unless otherwise instructed by your surgeon), Aleve , Naproxen , Ibuprofen , Motrin , Advil , Goody's, BC's, all herbal medications, fish oil, and all vitamins.   Coronavirus Screening Do you have any of the following symptoms:  Cough yes/no: No Fever (>100.50F)  yes/no: No Runny nose yes/no: No Sore throat yes/no: No Difficulty breathing/shortness of breath  yes/no: No  Have you traveled in the last 14 days and where? yes/no: No  Patient verbalized understanding of instructions that were given via phone.

## 2024-06-26 ENCOUNTER — Ambulatory Visit (HOSPITAL_COMMUNITY)
Admission: RE | Admit: 2024-06-26 | Discharge: 2024-06-26 | Disposition: A | Discharge status: Home / Self Care | Payer: Self-pay | Attending: Orthopedic Surgery | Admitting: Orthopedic Surgery

## 2024-06-26 ENCOUNTER — Ambulatory Visit (HOSPITAL_COMMUNITY): Payer: Self-pay

## 2024-06-26 ENCOUNTER — Other Ambulatory Visit: Payer: Self-pay

## 2024-06-26 ENCOUNTER — Encounter (HOSPITAL_COMMUNITY): Payer: Self-pay | Admitting: Orthopedic Surgery

## 2024-06-26 ENCOUNTER — Ambulatory Visit (HOSPITAL_BASED_OUTPATIENT_CLINIC_OR_DEPARTMENT_OTHER): Payer: Self-pay

## 2024-06-26 ENCOUNTER — Encounter (HOSPITAL_COMMUNITY)
Admission: RE | Disposition: A | Discharge status: Home / Self Care | Payer: Self-pay | Source: Home / Self Care | Attending: Orthopedic Surgery

## 2024-06-26 DIAGNOSIS — Z87891 Personal history of nicotine dependence: Secondary | ICD-10-CM | POA: Insufficient documentation

## 2024-06-26 DIAGNOSIS — X58XXXA Exposure to other specified factors, initial encounter: Secondary | ICD-10-CM | POA: Insufficient documentation

## 2024-06-26 DIAGNOSIS — S62306A Unspecified fracture of fifth metacarpal bone, right hand, initial encounter for closed fracture: Secondary | ICD-10-CM

## 2024-06-26 DIAGNOSIS — S62326A Displaced fracture of shaft of fifth metacarpal bone, right hand, initial encounter for closed fracture: Secondary | ICD-10-CM | POA: Insufficient documentation

## 2024-06-26 HISTORY — PX: OPEN REDUCTION INTERNAL FIXATION (ORIF) METACARPAL: SHX6234

## 2024-06-26 LAB — CBC
HCT: 45.2 % (ref 39.0–52.0)
Hemoglobin: 15.3 g/dL (ref 13.0–17.0)
MCH: 29 pg (ref 26.0–34.0)
MCHC: 33.8 g/dL (ref 30.0–36.0)
MCV: 85.6 fL (ref 80.0–100.0)
Platelets: 133 K/uL — ABNORMAL LOW (ref 150–400)
RBC: 5.28 MIL/uL (ref 4.22–5.81)
RDW: 13.1 % (ref 11.5–15.5)
WBC: 4.8 K/uL (ref 4.0–10.5)
nRBC: 0 % (ref 0.0–0.2)

## 2024-06-26 SURGERY — OPEN REDUCTION INTERNAL FIXATION (ORIF) METACARPAL
Anesthesia: Monitor Anesthesia Care | Site: Finger | Laterality: Right

## 2024-06-26 MED ORDER — PROPOFOL 500 MG/50ML IV EMUL
INTRAVENOUS | Status: DC | PRN
  Administered 2024-06-26: 100 ug/kg/min via INTRAVENOUS

## 2024-06-26 MED ORDER — CEFAZOLIN SODIUM-DEXTROSE 2-4 GM/100ML-% IV SOLN
INTRAVENOUS | Status: DC
Start: 2024-06-26 — End: 2024-06-26
  Filled 2024-06-26: qty 100

## 2024-06-26 MED ORDER — ACETAMINOPHEN 325 MG PO TABS: 650.0000 mg | ORAL_TABLET | Freq: Four times a day (QID) | ORAL | Status: AC

## 2024-06-26 MED ORDER — DEXAMETHASONE SOD PHOSPHATE PF 10 MG/ML IJ SOLN
INTRAMUSCULAR | Status: DC | PRN
  Administered 2024-06-26: 5 mg via INTRAVENOUS

## 2024-06-26 MED ORDER — 0.9 % SODIUM CHLORIDE (POUR BTL) OPTIME
TOPICAL | Status: DC | PRN
  Administered 2024-06-26: 1000 mL

## 2024-06-26 MED ORDER — CEFAZOLIN SODIUM-DEXTROSE 2-3 GM-%(50ML) IV SOLR
INTRAVENOUS | Status: DC | PRN
  Administered 2024-06-26: 2 g via INTRAVENOUS

## 2024-06-26 MED ORDER — ONDANSETRON HCL 4 MG/2ML IJ SOLN: 4.0000 mg | Freq: Once | INTRAMUSCULAR | Status: DC | PRN

## 2024-06-26 MED ORDER — PROPOFOL 10 MG/ML IV BOLUS
INTRAVENOUS | Status: AC
Start: 2024-06-26 — End: 2024-06-26
  Filled 2024-06-26: qty 20

## 2024-06-26 MED ORDER — CHLORHEXIDINE GLUCONATE 0.12 % MT SOLN
OROMUCOSAL | Status: AC
  Administered 2024-06-26: 15 mL via OROMUCOSAL
  Filled 2024-06-26: qty 15

## 2024-06-26 MED ORDER — MIDAZOLAM HCL 2 MG/2ML IJ SOLN
INTRAMUSCULAR | Status: AC
  Administered 2024-06-26: 2 mg via INTRAVENOUS
  Filled 2024-06-26: qty 2

## 2024-06-26 MED ORDER — LIDOCAINE 2% (20 MG/ML) 5 ML SYRINGE
INTRAMUSCULAR | Status: AC
  Filled 2024-06-26: qty 5

## 2024-06-26 MED ORDER — CHLORHEXIDINE GLUCONATE 0.12 % MT SOLN: 15.0000 mL | Freq: Once | OROMUCOSAL | Status: AC

## 2024-06-26 MED ORDER — ROPIVACAINE HCL 5 MG/ML IJ SOLN
INTRAMUSCULAR | Status: DC | PRN
  Administered 2024-06-26: 30 mL via PERINEURAL

## 2024-06-26 MED ORDER — VECURONIUM BROMIDE 10 MG IV SOLR
INTRAVENOUS | Status: AC
Start: 2024-06-26 — End: 2024-06-26
  Filled 2024-06-26: qty 10

## 2024-06-26 MED ORDER — OXYCODONE HCL 5 MG PO TABS: 5.0000 mg | ORAL_TABLET | Freq: Once | ORAL | Status: DC | PRN

## 2024-06-26 MED ORDER — FENTANYL CITRATE (PF) 100 MCG/2ML IJ SOLN: 100.0000 ug | Freq: Once | INTRAMUSCULAR | Status: AC

## 2024-06-26 MED ORDER — ONDANSETRON HCL 4 MG/2ML IJ SOLN
INTRAMUSCULAR | Status: DC | PRN
  Administered 2024-06-26: 4 mg via INTRAVENOUS

## 2024-06-26 MED ORDER — LIDOCAINE 2% (20 MG/ML) 5 ML SYRINGE
INTRAMUSCULAR | Status: DC | PRN
  Administered 2024-06-26: 40 mg via INTRAVENOUS

## 2024-06-26 MED ORDER — LACTATED RINGERS IV SOLN: INTRAVENOUS | Status: DC

## 2024-06-26 MED ORDER — ORAL CARE MOUTH RINSE: 15.0000 mL | Freq: Once | OROMUCOSAL | Status: AC

## 2024-06-26 MED ORDER — PROPOFOL 10 MG/ML IV BOLUS
INTRAVENOUS | Status: DC | PRN
  Administered 2024-06-26: 40 mg via INTRAVENOUS

## 2024-06-26 MED ORDER — CEFAZOLIN SODIUM-DEXTROSE 2-4 GM/100ML-% IV SOLN: 2.0000 g | INTRAVENOUS | Status: DC

## 2024-06-26 MED ORDER — FENTANYL CITRATE (PF) 100 MCG/2ML IJ SOLN: 25.0000 ug | INTRAMUSCULAR | Status: DC | PRN

## 2024-06-26 MED ORDER — FENTANYL CITRATE (PF) 100 MCG/2ML IJ SOLN
INTRAMUSCULAR | Status: AC
  Administered 2024-06-26: 100 ug via INTRAVENOUS
  Filled 2024-06-26: qty 2

## 2024-06-26 MED ORDER — OXYCODONE HCL 5 MG/5ML PO SOLN: 5.0000 mg | Freq: Once | ORAL | Status: DC | PRN

## 2024-06-26 MED ORDER — ONDANSETRON HCL 4 MG/2ML IJ SOLN
INTRAMUSCULAR | Status: AC
  Filled 2024-06-26: qty 2

## 2024-06-26 MED ORDER — OXYCODONE HCL 5 MG PO TABS: 5.0000 mg | ORAL_TABLET | Freq: Four times a day (QID) | ORAL | 0 refills | Status: AC | PRN

## 2024-06-26 MED ORDER — MIDAZOLAM HCL (PF) 2 MG/2ML IJ SOLN: 2.0000 mg | Freq: Once | INTRAMUSCULAR | Status: AC

## 2024-06-26 MED ORDER — ACETAMINOPHEN 10 MG/ML IV SOLN
1000.0000 mg | Freq: Once | INTRAVENOUS | Status: DC | PRN
Start: 2024-06-26 — End: 2024-06-26

## 2024-06-26 MED ORDER — IBUPROFEN 200 MG PO TABS: 600.0000 mg | ORAL_TABLET | Freq: Four times a day (QID) | ORAL | Status: AC

## 2024-06-26 MED ORDER — GLYCOPYRROLATE PF 0.2 MG/ML IJ SOSY
PREFILLED_SYRINGE | INTRAMUSCULAR | Status: AC
Start: 2024-06-26 — End: 2024-06-26
  Filled 2024-06-26: qty 1

## 2024-06-26 SURGICAL SUPPLY — 43 items
BAG COUNTER SPONGE SURGICOUNT (BAG) ×1 IMPLANT
BIT DRILL 1.1X60MM (BIT) IMPLANT
BLADE MINI RND TIP GREEN BEAV (BLADE) IMPLANT
BNDG COHESIVE 4X5 TAN STRL LF (GAUZE/BANDAGES/DRESSINGS) ×1 IMPLANT
BNDG COMPR ESMARK 4X3 LF (GAUZE/BANDAGES/DRESSINGS) ×1 IMPLANT
BNDG GAUZE DERMACEA FLUFF 4 (GAUZE/BANDAGES/DRESSINGS) ×1 IMPLANT
CANISTER SUCTION 3000ML PPV (SUCTIONS) IMPLANT
CHLORAPREP W/TINT 26 (MISCELLANEOUS) ×1 IMPLANT
CORD BIPOLAR FORCEPS 12FT (ELECTRODE) ×1 IMPLANT
COVER MAYO STAND STRL (DRAPES) IMPLANT
CUFF TOURN SGL QUICK 18X4 (TOURNIQUET CUFF) IMPLANT
DRAPE C-ARM 42X72 X-RAY (DRAPES) ×1 IMPLANT
DRAPE SURG 17X23 STRL (DRAPES) ×1 IMPLANT
DRIVER BIT 1.5 (TRAUMA) IMPLANT
DRSG EMULSION OIL 3X3 NADH (GAUZE/BANDAGES/DRESSINGS) ×1 IMPLANT
GAUZE SPONGE 4X4 12PLY STRL (GAUZE/BANDAGES/DRESSINGS) IMPLANT
GAUZE SPONGE 4X4 12PLY STRL LF (GAUZE/BANDAGES/DRESSINGS) ×1 IMPLANT
GLOVE BIO SURGEON STRL SZ7.5 (GLOVE) ×1 IMPLANT
GLOVE BIOGEL PI IND STRL 7.0 (GLOVE) ×1 IMPLANT
GLOVE BIOGEL PI IND STRL 8 (GLOVE) ×1 IMPLANT
GLOVE ECLIPSE 6.5 STRL STRAW (GLOVE) ×1 IMPLANT
GOWN STRL REUS W/ TWL LRG LVL3 (GOWN DISPOSABLE) ×2 IMPLANT
GOWN STRL REUS W/ TWL XL LVL3 (GOWN DISPOSABLE) ×1 IMPLANT
KIT BASIN OR (CUSTOM PROCEDURE TRAY) ×1 IMPLANT
NDL HYPO 25X1 1.5 SAFETY (NEEDLE) IMPLANT
NEEDLE HYPO 25X1 1.5 SAFETY (NEEDLE) IMPLANT
PACK ORTHO EXTREMITY (CUSTOM PROCEDURE TRAY) ×1 IMPLANT
PAD CAST 4YDX4 CTTN HI CHSV (CAST SUPPLIES) IMPLANT
PLATE T SMALL 1.5MM (Plate) IMPLANT
SCREW L 1.5X12 (Screw) IMPLANT
SCREW L 1.5X14 (Screw) IMPLANT
SCREW LOCK 1.5X15MM (Screw) IMPLANT
SCREW LOCKING 1.5X11MM (Screw) IMPLANT
SCREW LOCKING 1.5X13MM (Screw) IMPLANT
SLING ARM FOAM STRAP LRG (SOFTGOODS) IMPLANT
SOLN 0.9% NACL POUR BTL 1000ML (IV SOLUTION) ×1 IMPLANT
STOCKINETTE 6 STRL (DRAPES) ×1 IMPLANT
SUT VICRYL RAPIDE 4/0 PS 2 (SUTURE) IMPLANT
SYR 10ML LL (SYRINGE) IMPLANT
TOWEL GREEN STERILE FF (TOWEL DISPOSABLE) ×1 IMPLANT
TOWEL OR 17X24 6PK STRL BLUE (TOWEL DISPOSABLE) IMPLANT
TUBE CONNECTING 20X1/4 (TUBING) IMPLANT
UNDERPAD 30X36 HEAVY ABSORB (UNDERPADS AND DIAPERS) ×1 IMPLANT
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
## 2024-06-26 NOTE — Anesthesia Preprocedure Evaluation (Addendum)
 Anesthesia Evaluation  Patient identified by MRN, date of birth, ID band Patient awake    Reviewed: Allergy & Precautions, NPO status , Patient's Chart, lab work & pertinent test results, reviewed documented beta blocker date and time   History of Anesthesia Complications Negative for: history of anesthetic complications  Airway Mallampati: II  TM Distance: >3 FB     Dental no notable dental hx.    Pulmonary neg COPD, former smoker   breath sounds clear to auscultation       Cardiovascular (-) angina (-) CAD and (-) Past MI  Rhythm:Regular Rate:Normal     Neuro/Psych neg Seizures    GI/Hepatic ,neg GERD  ,,(+) neg Cirrhosis        Endo/Other    Renal/GU Renal disease     Musculoskeletal   Abdominal   Peds  Hematology   Anesthesia Other Findings   Reproductive/Obstetrics                              Anesthesia Physical Anesthesia Plan  ASA: 1  Anesthesia Plan: MAC and Regional   Post-op Pain Management: Regional block*   Induction: Intravenous  PONV Risk Score and Plan: 2 and Ondansetron , Dexamethasone  and Propofol  infusion  Airway Management Planned: Natural Airway and Simple Face Mask  Additional Equipment:   Intra-op Plan:   Post-operative Plan:   Informed Consent: I have reviewed the patients History and Physical, chart, labs and discussed the procedure including the risks, benefits and alternatives for the proposed anesthesia with the patient or authorized representative who has indicated his/her understanding and acceptance.     Dental advisory given  Plan Discussed with: CRNA  Anesthesia Plan Comments:          Anesthesia Quick Evaluation

## 2024-06-26 NOTE — Interval H&P Note (Signed)
 History and Physical Interval Note:  06/26/2024 12:28 PM  Chase Greer  has presented today for surgery, with the diagnosis of DISPLACED RIGHT FIFTH METACARPAL FRACTURE.  The various methods of treatment have been discussed with the patient and family. After consideration of risks, benefits and other options for treatment, the patient has consented to  Procedure(s): OPEN REDUCTION INTERNAL FIXATION (ORIF) METACARPAL (Right) as a surgical intervention.  The patient's history has been reviewed, patient examined, no change in status, stable for surgery.  I have reviewed the patient's chart and labs.  Questions were answered to the patient's satisfaction.     Chase Greer

## 2024-06-26 NOTE — Anesthesia Postprocedure Evaluation (Signed)
 Anesthesia Post Note  Patient: Chase Greer  Procedure(s) Performed: OPEN REDUCTION INTERNAL FIXATION (ORIF) METACARPAL, RIGHT (Right: Finger)     Patient location during evaluation: PACU Anesthesia Type: Regional Level of consciousness: awake and alert Pain management: pain level controlled Vital Signs Assessment: post-procedure vital signs reviewed and stable Respiratory status: spontaneous breathing, nonlabored ventilation, respiratory function stable and patient connected to nasal cannula oxygen Cardiovascular status: blood pressure returned to baseline and stable Postop Assessment: no apparent nausea or vomiting Anesthetic complications: no   No notable events documented.  Last Vitals:  Vitals:   06/26/24 1345 06/26/24 1400  BP: (!) 150/76 (!) 137/94  Pulse: (!) 55 (!) 52  Resp: 12 12  Temp: (!) 36.2 C (!) 36.2 C  SpO2: 100% 94%    Last Pain:  Vitals:   06/26/24 1400  TempSrc:   PainSc: 0-No pain                 [name]

## 2024-06-26 NOTE — Transfer of Care (Signed)
 Immediate Anesthesia Transfer of Care Note  Patient: Chase Greer  Procedure(s) Performed: OPEN REDUCTION INTERNAL FIXATION (ORIF) METACARPAL, RIGHT (Right: Finger)  Patient Location: PACU  Anesthesia Type:MAC  Level of Consciousness: awake, alert , and oriented  Airway & Oxygen Therapy: Patient Spontanous Breathing and Patient connected to face mask oxygen  Post-op Assessment: Report given to RN and Post -op Vital signs reviewed and stable  Post vital signs: Reviewed and stable  Last Vitals:  Vitals Value Taken Time  BP 150/76 06/26/24 13:45  Temp    Pulse 49 06/26/24 13:47  Resp 16 06/26/24 13:47  SpO2 99 % 06/26/24 13:47  Vitals shown include unfiled device data.  Last Pain:  Vitals:   06/26/24 1043  TempSrc:   PainSc: 10-Worst pain ever      Patients Stated Pain Goal: 3 (06/26/24 1043)  Complications: No notable events documented.

## 2024-06-26 NOTE — Anesthesia Procedure Notes (Signed)
 Anesthesia Regional Block: Axillary brachial plexus block   Pre-Anesthetic Checklist: , timeout performed,  Correct Patient, Correct Site, Correct Laterality,  Correct Procedure, Correct Position, site marked,  Risks and benefits discussed,  Surgical consent,  Pre-op evaluation,  At surgeon's request and post-op pain management  Laterality: Right  Prep: Maximum Sterile Barrier Precautions used, chloraprep       Needles:  Injection technique: Single-shot  Needle Type: Echogenic Needle      Needle Gauge: 20     Additional Needles:   Procedures:,,,, ultrasound used (permanent image in chart),,    Narrative:  Start time: 06/26/2024 12:40 PM End time: 06/26/2024 12:45 PM Injection made incrementally with aspirations every 5 mL.  Performed by: Personally  Anesthesiologist: Keneth Lynwood POUR, MD
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
## 2024-06-26 NOTE — Discharge Instructions (Addendum)
 Discharge Instructions  You have a dressing with a plaster splint incorporated in it. Move your fingers as much as possible, making a full fist and fully opening the fist. Elevate your hand to reduce pain & swelling of the digits.  Ice over the operative site may be helpful to reduce pain & swelling.  DO NOT USE HEAT. Take Tylenol  650 mg and ibuprofen  600 mg every hours for pain management. Take the Oxycodone  5 mg additionally for severe post operative pain management. Leave the dressing in place until you return to our office.  You may shower, but keep the bandage clean & dry.  You may drive a car when you are off of prescription pain medications and can safely control your vehicle with both hands. Call our office and schedule a follow up visit for 10-15 days from the date of surgery.   Please call [phone] during normal business hours or [phone] after hours for any problems. Including the following:  - excessive redness of the incisions - drainage for more than 4 days - fever of more than 101.5 F  *Please note that pain medications will not be refilled after hours or on weekends.   WORK STATUS: Out of work 06/26/24-06/27/24. May return to work on 06/28/24 with no lifting, gripping or grasping greater than 5 lbs with the right hand. 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 Keep bandage clean and dry.

## 2024-06-27 ENCOUNTER — Encounter (HOSPITAL_COMMUNITY): Payer: Self-pay | Admitting: Orthopedic Surgery

## 2024-08-09 ENCOUNTER — Ambulatory Visit
Admission: RE | Admit: 2024-08-09 | Discharge: 2024-08-09 | Disposition: A | Discharge status: Home / Self Care | Payer: Self-pay | Source: Ambulatory Visit | Attending: Internal Medicine | Admitting: Internal Medicine

## 2024-08-09 VITALS — BP 97/63 | HR 75 | Temp 97.6°F | Resp 16

## 2024-08-09 DIAGNOSIS — L0201 Cutaneous abscess of face: Secondary | ICD-10-CM

## 2024-08-09 MED ORDER — CEPHALEXIN 500 MG PO CAPS: 500.0000 mg | ORAL_CAPSULE | Freq: Three times a day (TID) | ORAL | 0 refills | Status: AC

## 2024-08-09 NOTE — ED Triage Notes (Signed)
 Might have insect bite - Entered by patient  Swollen lump on left jaw x 2 days. Not draining. States gums are stinging  and have irritation in my mouth.  No meds tried

## 2024-08-09 NOTE — Discharge Instructions (Addendum)
 Incision and drainage done of left facial abscess.  Large amounts of purulent drainage was expressed from the area.  Leave the current dressing in place until this evening then may remove and wash the area with soap and water.  Reapply clean dressing after this.  It is okay to shower but do not submerge the area underwater until it is completely healed.  Will need to likely apply dressing twice daily for 3 to 4 days then the area should be able to be left open to air.  Start Keflex  500 mg 3 times daily for 7 days.  This is an antibiotic.  Take this with food.  If you feel the area is not improving or if it worsens again then return to urgent care

## 2024-08-09 NOTE — ED Provider Notes (Signed)
 " [facility]    CSN: [id number] Arrival date & time: 08/09/24  0909      History   Chief Complaint Chief Complaint  Patient presents with   Facial Pain    HPI AVELINO HERREN is a 33 y.o. male.   33 year old male presents urgent care with complaints of swollen, painful area along the left jaw.  He reports this started about 2 days ago and has gotten much worse.  He reports he can actually feel it pushing on the inside of his mouth.  He denies fevers or chills.  He has not never had anything like this on his face before.  He denies having a bump there prior to this.     Past Medical History:  Diagnosis Date   ACL tear    COVID 05/09/2020   Meniscus, lateral, derangement, left     Patient Active Problem List   Diagnosis Date Noted   Lisfranc dislocation, right, initial encounter    Left anterior cruciate ligament tear 07/02/2016   Acute medial meniscal injury of knee, left, initial encounter 07/02/2016   Complex tear of lateral meniscus of left knee as current injury 07/02/2016    Past Surgical History:  Procedure Laterality Date   FINGER SURGERY Left    left ring finger   FOOT ARTHRODESIS Right 07/09/2021   Procedure: RIGHT MIDFOOT FUSION;  Surgeon: Harden Jerona GAILS, MD;  Location: [facility] OR;  Service: Orthopedics;  Laterality: Right;   OPEN REDUCTION INTERNAL FIXATION (ORIF) METACARPAL Right 06/26/2024   Procedure: OPEN REDUCTION INTERNAL FIXATION (ORIF) METACARPAL, RIGHT;  Surgeon: Sebastian Lenis, MD;  Location: [facility] OR;  Service: Orthopedics;  Laterality: Right;       Home Medications    Prior to Admission medications  Medication Sig Start Date End Date Taking? Authorizing Provider  cephALEXin  (KEFLEX ) 500 MG capsule Take 1 capsule (500 mg total) by mouth 3 (three) times daily for 7 days. 08/09/24 08/16/24 Yes Special Ranes A, PA-C  oxyCODONE  (ROXICODONE ) 5 MG immediate release tablet Take 1 tablet (5 mg total) by mouth every 6 (six) hours as  needed for severe pain (pain score 7-10). 06/26/24  Yes Sebastian Lenis, MD  acetaminophen  (TYLENOL ) 325 MG tablet Take 2 tablets (650 mg total) by mouth every 6 (six) hours. 06/26/24   Sebastian Lenis, MD  ibuprofen  (ADVIL ) 200 MG tablet Take 3 tablets (600 mg total) by mouth every 6 (six) hours. 06/26/24   Sebastian Lenis, MD    Family History Family History  Problem Relation Age of Onset   Hypertension Mother    Heart failure Mother    Hypertension Father     Social History Social History[1]   Allergies   Patient has no known allergies.   Review of Systems Review of Systems  Constitutional:  Negative for chills and fever.  HENT:  Negative for ear pain and sore throat.   Eyes:  Negative for pain and visual disturbance.  Respiratory:  Negative for cough and shortness of breath.   Cardiovascular:  Negative for chest pain and palpitations.  Gastrointestinal:  Negative for abdominal pain and vomiting.  Genitourinary:  Negative for dysuria and hematuria.  Musculoskeletal:  Negative for arthralgias and back pain.  Skin:  Negative for color change and rash.       Left preauricular/mandibular margin with swollen, painful area  Neurological:  Negative for seizures and syncope.  All other systems reviewed and are negative.    Physical Exam Triage Vital Signs ED Triage Vitals  Encounter Vitals Group     BP 08/09/24 0947 97/63     Girls Systolic BP Percentile --      Girls Diastolic BP Percentile --      Boys Systolic BP Percentile --      Boys Diastolic BP Percentile --      Pulse Rate 08/09/24 0947 75     Resp 08/09/24 0947 16     Temp 08/09/24 0947 97.6 F (36.4 C)     Temp Source 08/09/24 0947 Oral     SpO2 08/09/24 0947 96 %     Weight --      Height --      Head Circumference --      Peak Flow --      Pain Score 08/09/24 0946 2     Pain Loc --      Pain Education --      Exclude from Growth Chart --    No data found.  Updated Vital Signs BP 97/63 (BP  Location: Left Arm)   Pulse 75   Temp 97.6 F (36.4 C) (Oral)   Resp 16   SpO2 96%   Visual Acuity Right Eye Distance:   Left Eye Distance:   Bilateral Distance:    Right Eye Near:   Left Eye Near:    Bilateral Near:     Physical Exam Vitals and nursing note reviewed.  Constitutional:      General: He is not in acute distress.    Appearance: He is well-developed.  HENT:     Head: Normocephalic and atraumatic.   Eyes:     Conjunctiva/sclera: Conjunctivae normal.  Cardiovascular:     Rate and Rhythm: Normal rate and regular rhythm.     Heart sounds: No murmur heard. Pulmonary:     Effort: Pulmonary effort is normal. No respiratory distress.     Breath sounds: Normal breath sounds.  Abdominal:     Palpations: Abdomen is soft.     Tenderness: There is no abdominal tenderness.  Musculoskeletal:        General: No swelling.     Cervical back: Neck supple.  Skin:    General: Skin is warm and dry.     Capillary Refill: Capillary refill takes less than 2 seconds.  Neurological:     Mental Status: He is alert.  Psychiatric:        Mood and Affect: Mood normal.    Incision and drainage of facial abscess  Discussed risks and benefits with the patient regarding incision and drainage plus antibiotics versus antibiotics alone.  Recommended I&D with antibiotics as the likely best course.  Patient agrees and proceeded with incision and drainage.  The area was cleansed with Betadine .  Cold-Eeze spray was used to anesthetize the area then an 11 blade was used to make a stab incision.  Copious amounts of purulent drainage was expressed from the area.  Bleeding was controlled with pressure.  Clean dressing was applied.  Patient tolerated the procedure well with no apparent complications  UC Treatments / Results  Labs (all labs ordered are listed, but only abnormal results are displayed) Labs Reviewed - No data to display  EKG   Radiology No results  found.  Procedures Procedures (including critical care time)  Medications Ordered in UC Medications - No data to display  Initial Impression / Assessment and Plan / UC Course  I have reviewed the triage vital signs and the nursing notes.  Pertinent labs & imaging results  that were available during my care of the patient were reviewed by me and considered in my medical decision making (see chart for details).     Facial abscess   Incision and drainage done of left facial abscess.  Large amounts of purulent drainage was expressed from the area.  Leave the current dressing in place until this evening then may remove and wash the area with soap and water.  Reapply clean dressing after this.  It is okay to shower but do not submerge the area underwater until it is completely healed.  Will need to likely apply dressing twice daily for 3 to 4 days then the area should be able to be left open to air.  Start Keflex  500 mg 3 times daily for 7 days.  This is an antibiotic.  Take this with food.  If you feel the area is not improving or if it worsens again then return to urgent care  Final Clinical Impressions(s) / UC Diagnoses   Final diagnoses:  Facial abscess     Discharge Instructions      Incision and drainage done of left facial abscess.  Large amounts of purulent drainage was expressed from the area.  Leave the current dressing in place until this evening then may remove and wash the area with soap and water.  Reapply clean dressing after this.  It is okay to shower but do not submerge the area underwater until it is completely healed.  Will need to likely apply dressing twice daily for 3 to 4 days then the area should be able to be left open to air.  Start Keflex  500 mg 3 times daily for 7 days.  This is an antibiotic.  Take this with food.  If you feel the area is not improving or if it worsens again then return to urgent care     ED Prescriptions     Medication Sig Dispense Auth.  Provider   cephALEXin  (KEFLEX ) 500 MG capsule Take 1 capsule (500 mg total) by mouth 3 (three) times daily for 7 days. 21 capsule Teresa Almarie LABOR, [place]      PDMP not reviewed this encounter.    [1]  Social History Tobacco Use   Smoking status: Former    Types: Cigarettes   Smokeless tobacco: Never   Tobacco comments:    Quit smoking > 10 yrs ago  Vaping Use   Vaping status: Never Used  Substance Use Topics   Alcohol use: Yes    Alcohol/week: 3.0 standard drinks of alcohol    Types: 3 Standard drinks or equivalent per week    Comment: occasional   Drug use: Not Currently    Types: Marijuana    Comment: last use on 06/27/24     Teresa Almarie LABOR, PA-C 08/09/24 1025  "

## 2024-08-22 ENCOUNTER — Telehealth: Payer: Self-pay | Admitting: Emergency Medicine

## 2024-08-22 NOTE — Telephone Encounter (Signed)
 Pt called and sts his RX needs to be resent; informed pt just need to request pharmacy to refill since he didn't pick up first RX

## 2024-08-28 ENCOUNTER — Telehealth: Payer: Self-pay | Admitting: Emergency Medicine

## 2024-08-28 NOTE — Telephone Encounter (Signed)
 Pt incoming call request via patient access: [id number] [phone] c Chase Greer just called about his last visit and medication. question about missing a pill and his next steps. [phone]   Returned call to: Chase Greer   Info reviewed:  Chase Greer reports he missed 1 tablet while taking Keflex  and wanted to be advised on whether or not to take said tablet.   Outcome: Spoke with provider and was advised to tell pt to discard the last pill if it was not taken. Pt verbalized understanding. No further action required.    DOROTHA Schmitz, CCMA II

## 2024-09-14 ENCOUNTER — Ambulatory Visit
Admission: RE | Admit: 2024-09-14 | Discharge: 2024-09-14 | Disposition: A | Discharge status: Home / Self Care | Payer: Self-pay | Source: Ambulatory Visit

## 2024-09-14 VITALS — BP 134/84 | HR 84 | Temp 100.7°F | Resp 18

## 2024-09-14 DIAGNOSIS — J36 Peritonsillar abscess: Secondary | ICD-10-CM

## 2024-09-14 LAB — POCT RAPID STREP A (OFFICE): Rapid Strep A Screen: NEGATIVE

## 2024-09-14 MED ORDER — AMOXICILLIN-POT CLAVULANATE 875-125 MG PO TABS: 1.0000 | ORAL_TABLET | Freq: Two times a day (BID) | ORAL | 0 refills | Status: AC

## 2024-09-14 NOTE — ED Triage Notes (Signed)
 Pt reports sore throat x 2 days. States I think I have an abscess- it's hard to swallow. Also reports chills. No meds taken today. He was on keflex  earlier this month for dental abscess

## 2024-09-14 NOTE — Discharge Instructions (Signed)
 It appears that you have an abscess of your right tonsil. I will prescribe antibiotics and if your symptoms have not improved in 48 hrs we need to report to the ED. I recommend use of Tylenol  and/or ibuprofen  for pain.

## 2024-09-14 NOTE — ED Provider Notes (Signed)
 " [facility]    CSN: [id number] Arrival date & time: 09/14/24  1750      History   Chief Complaint Chief Complaint  Patient presents with   Sore Throat    Entered by patient    HPI Chase Greer is a 34 y.o. male.   Pt presents today due to 2 days worth of throat pain. Pt states that he has never had these symptoms before and has not been using medicine for symptoms. Pt is found to have a fever during triage and reports some difficulty swallowing.   The history is provided by the patient.  Sore Throat    Past Medical History:  Diagnosis Date   ACL tear    COVID 05/09/2020   Meniscus, lateral, derangement, left     Patient Active Problem List   Diagnosis Date Noted   Lisfranc dislocation, right, initial encounter    Left anterior cruciate ligament tear 07/02/2016   Acute medial meniscal injury of knee, left, initial encounter 07/02/2016   Complex tear of lateral meniscus of left knee as current injury 07/02/2016    Past Surgical History:  Procedure Laterality Date   FINGER SURGERY Left    left ring finger   FOOT ARTHRODESIS Right 07/09/2021   Procedure: RIGHT MIDFOOT FUSION;  Surgeon: Harden Jerona GAILS, MD;  Location: [facility] OR;  Service: Orthopedics;  Laterality: Right;   OPEN REDUCTION INTERNAL FIXATION (ORIF) METACARPAL Right 06/26/2024   Procedure: OPEN REDUCTION INTERNAL FIXATION (ORIF) METACARPAL, RIGHT;  Surgeon: Sebastian Lenis, MD;  Location: [facility] OR;  Service: Orthopedics;  Laterality: Right;       Home Medications    Prior to Admission medications  Medication Sig Start Date End Date Taking? Authorizing Provider  amoxicillin -clavulanate (AUGMENTIN ) 875-125 MG tablet Take 1 tablet by mouth every 12 (twelve) hours for 14 days. 09/14/24 09/28/24 Yes Andra Corean BROCKS, PA-C  acetaminophen  (TYLENOL ) 325 MG tablet Take 2 tablets (650 mg total) by mouth every 6 (six) hours. Patient not taking: Reported on 09/14/2024 06/26/24   Sebastian Lenis,  MD  cephALEXin  (KEFLEX ) 500 MG capsule Take 500 mg by mouth 3 (three) times daily. Patient not taking: Reported on 09/14/2024 08/22/24   [provider]  ibuprofen  (ADVIL ) 200 MG tablet Take 3 tablets (600 mg total) by mouth every 6 (six) hours. Patient not taking: Reported on 09/14/2024 06/26/24   Sebastian Lenis, MD  oxyCODONE  (ROXICODONE ) 5 MG immediate release tablet Take 1 tablet (5 mg total) by mouth every 6 (six) hours as needed for severe pain (pain score 7-10). Patient not taking: Reported on 09/14/2024 06/26/24   Sebastian Lenis, MD    Family History Family History  Problem Relation Age of Onset   Hypertension Mother    Heart failure Mother    Hypertension Father     Social History Social History[1]   Allergies   Patient has no known allergies.   Review of Systems Review of Systems   Physical Exam Triage Vital Signs ED Triage Vitals  Encounter Vitals Group     BP 09/14/24 1813 134/84     Girls Systolic BP Percentile --      Girls Diastolic BP Percentile --      Boys Systolic BP Percentile --      Boys Diastolic BP Percentile --      Pulse Rate 09/14/24 1813 84     Resp 09/14/24 1813 18     Temp 09/14/24 1813 (!) 100.7 F (38.2 C)  Temp Source 09/14/24 1813 Oral     SpO2 09/14/24 1813 95 %     Weight --      Height --      Head Circumference --      Peak Flow --      Pain Score 09/14/24 1811 10     Pain Loc --      Pain Education --      Exclude from Growth Chart --    No data found.  Updated Vital Signs BP 134/84 (BP Location: Left Arm)   Pulse 84   Temp (!) 100.7 F (38.2 C) (Oral)   Resp 18   SpO2 95%   Visual Acuity Right Eye Distance:   Left Eye Distance:   Bilateral Distance:    Right Eye Near:   Left Eye Near:    Bilateral Near:     Physical Exam Vitals and nursing note reviewed.  Constitutional:      General: He is not in acute distress.    Appearance: Normal appearance. He is not ill-appearing, toxic-appearing or  diaphoretic.  HENT:     Mouth/Throat:     Tonsils: Tonsillar abscess present. 4+ on the right. 1+ on the left.  Eyes:     General: No scleral icterus. Cardiovascular:     Rate and Rhythm: Normal rate and regular rhythm.     Heart sounds: Normal heart sounds.  Pulmonary:     Effort: Pulmonary effort is normal. No respiratory distress.     Breath sounds: Normal breath sounds. No wheezing or rhonchi.  Musculoskeletal:     Cervical back: Tenderness (right side) present.  Lymphadenopathy:     Cervical: Cervical adenopathy (right side) present.  Skin:    General: Skin is warm.  Neurological:     Mental Status: He is alert and oriented to person, place, and time.  Psychiatric:        Mood and Affect: Mood normal.        Behavior: Behavior normal.      UC Treatments / Results  Labs (all labs ordered are listed, but only abnormal results are displayed) Labs Reviewed  POCT RAPID STREP A (OFFICE) - Normal    EKG   Radiology No results found.  Procedures Procedures (including critical care time)  Medications Ordered in UC Medications - No data to display  Initial Impression / Assessment and Plan / UC Course  I have reviewed the triage vital signs and the nursing notes.  Pertinent labs & imaging results that were available during my care of the patient were reviewed by me and considered in my medical decision making (see chart for details).     Final Clinical Impressions(s) / UC Diagnoses   Final diagnoses:  Peritonsillar abscess     Discharge Instructions      It appears that you have an abscess of your right tonsil. I will prescribe antibiotics and if your symptoms have not improved in 48 hrs we need to report to the ED. I recommend use of Tylenol  and/or ibuprofen  for pain.     ED Prescriptions     Medication Sig Dispense Auth. Provider   amoxicillin -clavulanate (AUGMENTIN ) 875-125 MG tablet Take 1 tablet by mouth every 12 (twelve) hours for 14 days. 28  tablet Andra Corean BROCKS, PA-C      PDMP not reviewed this encounter.    [1]  Social History Tobacco Use   Smoking status: Former    Types: Cigarettes   Smokeless tobacco: Never  Tobacco comments:    Quit smoking > 10 yrs ago  Vaping Use   Vaping status: Never Used  Substance Use Topics   Alcohol use: Yes    Alcohol/week: 3.0 standard drinks of alcohol    Types: 3 Standard drinks or equivalent per week    Comment: occasional   Drug use: Not Currently    Types: Marijuana    Comment: last use on 06/27/24     Andra Corean BROCKS, PA-C 09/14/24 1849  "
